# Patient Record
Sex: Male | Born: 1995 | Race: White | Hispanic: No | Marital: Single | Smoking: Former smoker
Health system: Southern US, Community
[De-identification: ages and names within clinical notes are randomized; demographics above are authoritative.]

## PROBLEM LIST (undated history)

## (undated) HISTORY — PX: NO PAST SURGERIES: SHX2092

---
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
## 2018-07-03 ENCOUNTER — Other Ambulatory Visit: Payer: Self-pay

## 2018-07-03 ENCOUNTER — Emergency Department
Admission: EM | Admit: 2018-07-03 | Discharge: 2018-07-03 | Disposition: A | Discharge status: Home / Self Care | Payer: 59 | Attending: Emergency Medicine | Admitting: Emergency Medicine

## 2018-07-03 ENCOUNTER — Encounter: Payer: Self-pay | Admitting: Emergency Medicine

## 2018-07-03 DIAGNOSIS — R58 Hemorrhage, not elsewhere classified: Secondary | ICD-10-CM | POA: Diagnosis not present

## 2018-07-03 DIAGNOSIS — I809 Phlebitis and thrombophlebitis of unspecified site: Secondary | ICD-10-CM

## 2018-07-03 DIAGNOSIS — I808 Phlebitis and thrombophlebitis of other sites: Secondary | ICD-10-CM | POA: Diagnosis not present

## 2018-07-03 DIAGNOSIS — L819 Disorder of pigmentation, unspecified: Secondary | ICD-10-CM | POA: Diagnosis present

## 2018-07-03 NOTE — ED Provider Notes (Signed)
[facility] Emergency Department Provider Note  ____________________________________________  Time seen: Approximately 7:08 PM  I have reviewed the triage vital signs and the nursing notes.   HISTORY  Chief Complaint Arm Injury    HPI Dylan Huang is a 22 y.o. male resents emergency department with his mother for complaint of bruise to the left forearm.  Patient reports a bruise to the left medial forearm that he first visualized yesterday.  He reports that he believes it to be a bruise, however his mother is concerned that it may be a blood clot.  Patient has no bleeding or clotting disorders.  He denies any significant trauma to the area.  No recent surgeries, infections, hormone use, long distance travel.  Patient denies any pain to the area.  He denies any swelling to the extremity.  Patient reports that he is here to reassure his mother that he does not have a blood clot.   Patient denies any palpitations, chest pain, shortness of breath, coughing.  History reviewed. No pertinent past medical history.  Patient Active Problem List   Diagnosis Date Noted  . OBESITY, NOS 01/25/2007    History reviewed. No pertinent surgical history.  Prior to Admission medications   Medication Sig Start Date End Date Taking? Authorizing Provider  HYDROcodone-acetaminophen (NORCO) 5-325 MG tablet Take 1 tablet by mouth every 6 (six) hours as needed for moderate pain. 07/02/16   Irean Hong, MD  hydrOXYzine (ATARAX/VISTARIL) 50 MG tablet Take 1 tablet (50 mg total) by mouth 3 (three) times daily as needed for itching. 09/20/15   Joni Reining, PA-C  ibuprofen (ADVIL,MOTRIN) 800 MG tablet Take 1 tablet (800 mg total) by mouth every 8 (eight) hours as needed for moderate pain. 07/02/16   Irean Hong, MD  methylPREDNISolone (MEDROL DOSEPAK) 4 MG TBPK tablet Take Tapered dose as directed 09/20/15   Joni Reining, PA-C    Allergies Cinnamon and Nickel  No family history on  file.  Social History Social History   Tobacco Use  . Smoking status: Never Smoker  . Smokeless tobacco: Never Used  Substance Use Topics  . Alcohol use: No  . Drug use: No     Review of Systems  Constitutional: No fever/chills Eyes: No visual changes.  Cardiovascular: no chest pain. Respiratory: no cough. No SOB. Gastrointestinal: No abdominal pain.  No nausea, no vomiting.   Musculoskeletal: Negative for musculoskeletal pain. Skin: Area of ecchymosis to the left medial forearm Neurological: Negative for headaches, focal weakness or numbness. 10-point ROS otherwise negative.  ____________________________________________   PHYSICAL EXAM:  VITAL SIGNS: ED Triage Vitals  Enc Vitals Group     BP 07/03/18 1833 (!) 144/82     Pulse Rate 07/03/18 1833 72     Resp --      Temp 07/03/18 1833 98.2 F (36.8 C)     Temp Source 07/03/18 1833 Oral     SpO2 07/03/18 1833 100 %     Weight 07/03/18 1833 240 lb (108.9 kg)     Height 07/03/18 1833 6\' 1"  (1.854 m)     Head Circumference --      Peak Flow --      Pain Score 07/03/18 1839 1     Pain Loc --      Pain Edu? --      Excl. in GC? --      Constitutional: Alert and oriented. Well appearing and in no acute distress. Eyes: Conjunctivae are normal.  PERRL. EOMI. Head: Atraumatic. Neck: No stridor.    Cardiovascular: Normal rate, regular rhythm. Normal S1 and S2.  Good peripheral circulation. Respiratory: Normal respiratory effort without tachypnea or retractions. Lungs CTAB. Good air entry to the bases with no decreased or absent breath sounds. Musculoskeletal: Full range of motion to all extremities. No gross deformities appreciated. Neurologic:  Normal speech and language. No gross focal neurologic deficits are appreciated.  Skin:  Skin is warm, dry and intact. No rash noted.  Small area of ecchymosis noted to the left medial forearm.  This measures approximately 3 cm in diameter.  Area feels to be healing with  yellow-green-blue discoloration around the edges of ecchymosis.  Area is nontender to palpation.  Central areas consistent with phlebitis.  Left upper extremity has overall visibly unremarkable with the exception of ecchymosis.  No other areas of ecchymosis are appreciated.  No edema, erythema noted to the entire upper extremity.  No track marks. Psychiatric: Mood and affect are normal. Speech and behavior are normal. Patient exhibits appropriate insight and judgement.   ____________________________________________   LABS (all labs ordered are listed, but only abnormal results are displayed)  Labs Reviewed - No data to display ____________________________________________  EKG   ____________________________________________  RADIOLOGY   No results found.  ____________________________________________    PROCEDURES  Procedure(s) performed:    Procedures    Medications - No data to display   ____________________________________________   INITIAL IMPRESSION / ASSESSMENT AND PLAN / ED COURSE  Pertinent labs & imaging results that were available during my care of the patient were reviewed by me and considered in my medical decision making (see chart for details).  Review of the [place] CSRS was performed in accordance of the [facility] prior to dispensing any controlled drugs.      Patient's diagnosis is consistent with ecchymosis/phlebitis to left forearm.  Patient presents with his mother who is concerned that the patient may have a DVT.  Patient has solitary area of ecchymosis to the left forearm.  No indication of DVT on exam.  Patient has findings consistent with facial phlebitis underlying ecchymosis.  No bleeding or clotting disorders.  Mother is concerned as patient's grandfather had a history of DVT and MI.  I recommended warm compresses to the area with anti-inflammatories at home.  Prescriptions at this time.  No indication for labs or imaging of the area..  Patient will  follow-up with primary care as needed.  Patient is given ED precautions to return to the ED for any worsening or new symptoms.     ____________________________________________  FINAL CLINICAL IMPRESSION(S) / ED DIAGNOSES  Final diagnoses:  Ecchymosis  Phlebitis      NEW MEDICATIONS STARTED DURING THIS VISIT:  ED Discharge Orders    None          This chart was dictated using voice recognition software/Dragon. Despite best efforts to proofread, errors can occur which can change the meaning. Any change was purely unintentional.    Racheal PatchesCuthriell, Jonathan D, PA-C 07/03/18 1914    Minna AntisPaduchowski, Kevin, MD 07/03/18 2203

## 2018-07-03 NOTE — ED Notes (Signed)
Pt reports that he has a bruise on inner aspect of left forearm - pt denies any injury and is only here because mother feels like it could be a blood clot

## 2018-07-03 NOTE — ED Triage Notes (Signed)
Patient has an area of blue green discoloration to right fore arm. Mom is concerned patient has a blood clot in arm.  Noticed area yesterday.  Patient does not recall injuring arm.

## 2018-12-17 ENCOUNTER — Other Ambulatory Visit: Payer: Self-pay

## 2018-12-17 ENCOUNTER — Ambulatory Visit
Admission: EM | Admit: 2018-12-17 | Discharge: 2018-12-17 | Disposition: A | Discharge status: Home / Self Care | Payer: BLUE CROSS/BLUE SHIELD | Attending: Family Medicine | Admitting: Family Medicine

## 2018-12-17 DIAGNOSIS — R509 Fever, unspecified: Secondary | ICD-10-CM | POA: Insufficient documentation

## 2018-12-17 LAB — URINALYSIS, COMPLETE (UACMP) WITH MICROSCOPIC
Bacteria, UA: NONE SEEN
Bilirubin Urine: NEGATIVE
GLUCOSE, UA: NEGATIVE mg/dL
Ketones, ur: NEGATIVE mg/dL
Leukocytes, UA: NEGATIVE
Nitrite: NEGATIVE
PH: 5.5 (ref 5.0–8.0)
PROTEIN: NEGATIVE mg/dL
SQUAMOUS EPITHELIAL / LPF: NONE SEEN (ref 0–5)
Specific Gravity, Urine: <1.005 — ABNORMAL LOW (ref 1.005–1.030)
WBC, UA: NONE SEEN WBC/hpf (ref 0–5)

## 2018-12-17 LAB — CBC WITH DIFFERENTIAL/PLATELET
Abs Immature Granulocytes: 0.03 10*3/uL (ref 0.00–0.07)
Basophils Absolute: 0 10*3/uL (ref 0.0–0.1)
Basophils Relative: 0 %
Eosinophils Absolute: 0 10*3/uL (ref 0.0–0.5)
Eosinophils Relative: 0 %
HCT: 42.4 % (ref 39.0–52.0)
Hemoglobin: 14.2 g/dL (ref 13.0–17.0)
Immature Granulocytes: 0 %
Lymphocytes Relative: 15 %
Lymphs Abs: 1.1 10*3/uL (ref 0.7–4.0)
MCH: 28.9 pg (ref 26.0–34.0)
MCHC: 33.5 g/dL (ref 30.0–36.0)
MCV: 86.4 fL (ref 80.0–100.0)
Monocytes Absolute: 1.2 10*3/uL — ABNORMAL HIGH (ref 0.1–1.0)
Monocytes Relative: 16 %
Neutro Abs: 5.3 10*3/uL (ref 1.7–7.7)
Neutrophils Relative %: 69 %
Platelets: 277 10*3/uL (ref 150–400)
RBC: 4.91 MIL/uL (ref 4.22–5.81)
RDW: 13.4 % (ref 11.5–15.5)
WBC: 7.7 10*3/uL (ref 4.0–10.5)
nRBC: 0 % (ref 0.0–0.2)

## 2018-12-17 LAB — COMPREHENSIVE METABOLIC PANEL
ALT: 30 U/L (ref 0–44)
AST: 44 U/L — ABNORMAL HIGH (ref 15–41)
Albumin: 4.6 g/dL (ref 3.5–5.0)
Alkaline Phosphatase: 55 U/L (ref 38–126)
Anion gap: 12 (ref 5–15)
BUN: 13 mg/dL (ref 6–20)
CO2: 25 mmol/L (ref 22–32)
Calcium: 9 mg/dL (ref 8.9–10.3)
Chloride: 99 mmol/L (ref 98–111)
Creatinine, Ser: 1.3 mg/dL — ABNORMAL HIGH (ref 0.61–1.24)
GFR calc Af Amer: >60 mL/min (ref >60)
GFR calc non Af Amer: >60 mL/min (ref >60)
Glucose, Bld: 92 mg/dL (ref 70–99)
Potassium: 4 mmol/L (ref 3.5–5.1)
Sodium: 136 mmol/L (ref 135–145)
Total Bilirubin: 0.5 mg/dL (ref 0.3–1.2)
Total Protein: 8 g/dL (ref 6.5–8.1)

## 2018-12-17 LAB — SEDIMENTATION RATE: Sed Rate: 13 mm/hr (ref 0–15)

## 2018-12-17 NOTE — ED Provider Notes (Signed)
[facility]    CSN: [id number] Arrival date & time: 12/17/18  1613     History   Chief Complaint Chief Complaint  Patient presents with  . Fever  . Dysuria    HPI Dylan Huang is a 23 y.o. male.   HPI  Male presents with fevers that he has had for the past 3 weeks.  He states that it associated with a dry cough and migrainous type headaches.  He states that his temperature was 101.5 earlier today but upon admission to our clinic it was 99.1.  He was seen at the emergency room yesterday for his the same problems.  Burning sensation with urination for about 3 weeks.  States that there is no chance of him having STD since he has been sexually inactive.  Patient has no pertinent past medical history.  Temperatures have vacillated between 101.7 and normal.  Yesterday at the emergency room reported chills nausea and vomiting.  Had a productive cough.  Endorses back pain.  He has not had previous symptoms similar to this and takes no daily medications is had no recent foreign travel.  Denies alcohol tobacco and drug use.          History reviewed. No pertinent past medical history.  Patient Active Problem List   Diagnosis Date Noted  . OBESITY, NOS 01/25/2007    History reviewed. No pertinent surgical history.     Home Medications    Prior to Admission medications   Not on File    Family History History reviewed. No pertinent family history.  Social History Social History   Tobacco Use  . Smoking status: Never Smoker  . Smokeless tobacco: Never Used  Substance Use Topics  . Alcohol use: No  . Drug use: No     Allergies   Cinnamon and Nickel   Review of Systems Review of Systems  Constitutional: Positive for activity change, chills, fatigue and fever. Negative for unexpected weight change.  Gastrointestinal: Positive for nausea and vomiting.  Genitourinary: Positive for dysuria.  Musculoskeletal: Positive for back pain.  All other  systems reviewed and are negative.    Physical Exam Triage Vital Signs ED Triage Vitals  Enc Vitals Group     BP 12/17/18 1635 126/70     Pulse Rate 12/17/18 1635 99     Resp 12/17/18 1635 18     Temp 12/17/18 1635 99.1 F (37.3 C)     Temp Source 12/17/18 1635 Oral     SpO2 12/17/18 1635 100 %     Weight 12/17/18 1634 240 lb (108.9 kg)     Height 12/17/18 1634 6\' 1"  (1.854 m)     Head Circumference --      Peak Flow --      Pain Score 12/17/18 1634 6     Pain Loc --      Pain Edu? --      Excl. in GC? --    No data found.  Updated Vital Signs BP 126/70 (BP Location: Right Arm)   Pulse 99   Temp 99.1 F (37.3 C) (Oral)   Resp 18   Ht 6\' 1"  (1.854 m)   Wt 240 lb (108.9 kg)   SpO2 100%   BMI 31.66 kg/m   Visual Acuity Right Eye Distance:   Left Eye Distance:   Bilateral Distance:    Right Eye Near:   Left Eye Near:    Bilateral Near:     Physical Exam Vitals  signs and nursing note reviewed.  Constitutional:      General: He is not in acute distress.    Appearance: Normal appearance. He is not ill-appearing, toxic-appearing or diaphoretic.  HENT:     Head: Normocephalic.     Right Ear: Tympanic membrane, ear canal and external ear normal.     Left Ear: Tympanic membrane, ear canal and external ear normal.     Nose: Nose normal. No congestion or rhinorrhea.     Mouth/Throat:     Mouth: Mucous membranes are moist.     Pharynx: No oropharyngeal exudate or posterior oropharyngeal erythema.  Eyes:     General:        Right eye: No discharge.        Left eye: No discharge.     Conjunctiva/sclera: Conjunctivae normal.  Neck:     Musculoskeletal: Normal range of motion and neck supple.  Pulmonary:     Effort: Pulmonary effort is normal.     Breath sounds: Normal breath sounds.  Abdominal:     General: Bowel sounds are normal.     Palpations: Abdomen is soft.  Musculoskeletal: Normal range of motion.        General: No swelling, tenderness, deformity or  signs of injury.     Right lower leg: No edema.     Left lower leg: No edema.  Lymphadenopathy:     Cervical: No cervical adenopathy.  Skin:    General: Skin is warm and dry.  Neurological:     General: No focal deficit present.     Mental Status: He is alert and oriented to person, place, and time.  Psychiatric:        Mood and Affect: Mood normal.        Behavior: Behavior normal.        Thought Content: Thought content normal.        Judgment: Judgment normal.      UC Treatments / Results  Labs (all labs ordered are listed, but only abnormal results are displayed) Labs Reviewed  URINALYSIS, COMPLETE (UACMP) WITH MICROSCOPIC - Abnormal; Notable for the following components:      Result Value   Specific Gravity, Urine <1.005 (*)    Hgb urine dipstick TRACE (*)    All other components within normal limits  CBC WITH DIFFERENTIAL/PLATELET - Abnormal; Notable for the following components:   Monocytes Absolute 1.2 (*)    All other components within normal limits  COMPREHENSIVE METABOLIC PANEL - Abnormal; Notable for the following components:   Creatinine, Ser 1.30 (*)    AST 44 (*)    All other components within normal limits  SEDIMENTATION RATE    EKG None  Radiology No results found.  Procedures Procedures (including critical care time)  Medications Ordered in UC Medications - No data to display  Initial Impression / Assessment and Plan / UC Course  I have reviewed the triage vital signs and the nursing notes.  Pertinent labs & imaging results that were available during my care of the patient were reviewed by me and considered in my medical decision making (see chart for details).   I reviewed the laboratory findings and his physical exam.  There are no alarming findings today; believe because of his continuing diffuse and nonspecific complaints along with the fevers he is reporting that further work-up is warranted with a primary care physician.  Requested   antibiotics today and I told him it is not indicated since I do not  know what is diagnosis or what we be treating.  Need to follow-up with the primary care physician for further management.  No medications were given at this time.  Worsens he should go to the emergency room.   Final Clinical Impressions(s) / UC Diagnoses   Final diagnoses:  Fever, unspecified     Discharge Instructions     Drink plenty of fluids to keep your urine pale yellow or clear.  Follow-up with your primary care physician as soon as possible in order to continue the work-up.    ED Prescriptions    None     Controlled Substance Prescriptions [place] Controlled Substance Registry consulted? Not Applicable   Lutricia Feil, PA-C 12/17/18 2121

## 2018-12-17 NOTE — ED Triage Notes (Addendum)
Pt with fevers for past 3 weeks. Dry cough and "migraines".  States it was 101.5 earlier today.  Was seen at ER for same yesterday. Also does report burning sensation with urination for about 3 weeks. Denies unprotected sex and states no chance of STD

## 2018-12-17 NOTE — Discharge Instructions (Addendum)
Drink plenty of fluids to keep your urine pale yellow or clear.  Follow-up with your primary care physician as soon as possible in order to continue the work-up.

## 2021-04-01 ENCOUNTER — Ambulatory Visit: Payer: BC Managed Care – PPO | Admitting: Urology

## 2021-04-01 ENCOUNTER — Encounter: Payer: Self-pay | Admitting: Urology

## 2021-04-01 ENCOUNTER — Other Ambulatory Visit: Payer: Self-pay

## 2021-04-01 VITALS — BP 134/83 | HR 67 | Ht 73.0 in | Wt 240.0 lb

## 2021-04-01 DIAGNOSIS — N3281 Overactive bladder: Secondary | ICD-10-CM | POA: Diagnosis not present

## 2021-04-01 DIAGNOSIS — R32 Unspecified urinary incontinence: Secondary | ICD-10-CM | POA: Diagnosis not present

## 2021-04-01 LAB — URINALYSIS, COMPLETE
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
## 2021-04-01 NOTE — Patient Instructions (Signed)
Cystoscopy Cystoscopy is a procedure that is used to help diagnose and sometimes treat conditions that affect the lower urinary tract. The lower urinary tract includes the bladder and the urethra. The urethra is the tube that drains urine from the bladder. Cystoscopy is done using a thin, tube-shaped instrument with a light and camera at the end (cystoscope). The cystoscope may be hard or flexible, depending on the goal of the procedure. The cystoscope is inserted through the urethra, into the bladder. Cystoscopy may be recommended if you have:  Urinary tract infections that keep coming back.  Blood in the urine (hematuria).  An inability to control when you urinate (urinary incontinence) or an overactive bladder.  Unusual cells found in a urine sample.  A blockage in the urethra, such as a urinary stone.  Painful urination.  An abnormality in the bladder found during an intravenous pyelogram (IVP) or CT scan. Cystoscopy may also be done to remove a sample of tissue to be examined under a microscope (biopsy). What are the risks? Generally, this is a safe procedure. However, problems may occur, including:  Infection.  Bleeding.  What happens during the procedure?  1. You will be given one or more of the following: ? A medicine to numb the area (local anesthetic). 2. The area around the opening of your urethra will be cleaned. 3. The cystoscope will be passed through your urethra into your bladder. 4. Germ-free (sterile) fluid will flow through the cystoscope to fill your bladder. The fluid will stretch your bladder so that your health care provider can clearly examine your bladder walls. 5. Your doctor will look at the urethra and bladder. 6. The cystoscope will be removed The procedure may vary among health care providers  What can I expect after the procedure? After the procedure, it is common to have: 1. Some soreness or pain in your abdomen and urethra. 2. Urinary symptoms.  These include: ? Mild pain or burning when you urinate. Pain should stop within a few minutes after you urinate. This may last for up to 1 week. ? A small amount of blood in your urine for several days. ? Feeling like you need to urinate but producing only a small amount of urine. Follow these instructions at home: General instructions  Return to your normal activities as told by your health care provider.   Do not drive for 24 hours if you were given a sedative during your procedure.  Watch for any blood in your urine. If the amount of blood in your urine increases, call your health care provider.  If a tissue sample was removed for testing (biopsy) during your procedure, it is up to you to get your test results. Ask your health care provider, or the department that is doing the test, when your results will be ready.  Drink enough fluid to keep your urine pale yellow.  Keep all follow-up visits as told by your health care provider. This is important. Contact a health care provider if you:  Have pain that gets worse or does not get better with medicine, especially pain when you urinate.  Have trouble urinating.  Have more blood in your urine. Get help right away if you:  Have blood clots in your urine.  Have abdominal pain.  Have a fever or chills.  Are unable to urinate. Summary  Cystoscopy is a procedure that is used to help diagnose and sometimes treat conditions that affect the lower urinary tract.  Cystoscopy is done using   a thin, tube-shaped instrument with a light and camera at the end.  After the procedure, it is common to have some soreness or pain in your abdomen and urethra.  Watch for any blood in your urine. If the amount of blood in your urine increases, call your health care provider.  If you were prescribed an antibiotic medicine, take it as told by your health care provider. Do not stop taking the antibiotic even if you start to feel better. This  information is not intended to replace advice given to you by your health care provider. Make sure you discuss any questions you have with your health care provider. Document Revised: 11/06/2018 Document Reviewed: 11/06/2018 Elsevier Patient Education  2020 [employer]   

## 2021-04-02 LAB — MICROSCOPIC EXAMINATION
Bacteria, UA: NONE SEEN
Epithelial Cells (non renal): NONE SEEN /hpf (ref 0–10)

## 2021-04-02 LAB — URINALYSIS, COMPLETE
Bilirubin, UA: NEGATIVE
Glucose, UA: NEGATIVE
Ketones, UA: NEGATIVE
Leukocytes,UA: NEGATIVE
Nitrite, UA: POSITIVE — AB
Protein,UA: NEGATIVE
RBC, UA: NEGATIVE
Urobilinogen, Ur: 0.2 mg/dL (ref 0.2–1.0)
pH, UA: 7.5 (ref 5.0–7.5)

## 2021-04-07 ENCOUNTER — Encounter: Payer: Self-pay | Admitting: Urology

## 2021-04-07 ENCOUNTER — Ambulatory Visit (INDEPENDENT_AMBULATORY_CARE_PROVIDER_SITE_OTHER): Payer: BC Managed Care – PPO | Admitting: Urology

## 2021-04-07 ENCOUNTER — Other Ambulatory Visit: Payer: Self-pay

## 2021-04-07 VITALS — BP 149/73 | HR 73 | Ht 73.0 in | Wt 240.0 lb

## 2021-04-07 DIAGNOSIS — N3281 Overactive bladder: Secondary | ICD-10-CM

## 2021-04-07 NOTE — Progress Notes (Signed)
He was originally scheduled for cystoscopy today for evaluation of 1 week of urinary urgency, frequency, nocturia.  He has a history of a meatotomy as a child.  Urinalysis was benign.  We gave him samples of Myrbetriq for his symptoms until he can get in for cystoscopy, and he reports complete resolution with the Myrbetriq.  He would like to defer cystoscopy today.  We discussed the risks of missing pathology like urethral stricture or other abnormalities, and he understands these risk.  I recommend he stop the Myrbetriq in the next few days to see if his symptoms return.  RTC 1 month symptom check with UA and PVR.  Return precautions discussed extensively  Legrand Rams, MD 04/07/2021

## 2021-04-15 ENCOUNTER — Telehealth: Payer: Self-pay

## 2021-04-15 NOTE — Telephone Encounter (Signed)
Physician Assistant Royal Hawthorn Piersing left a message on triage line asking Provider to call him on his cell to discuss his recent visit with patient Taygen Acklin. PA cell number is [phone]

## 2021-04-21 ENCOUNTER — Ambulatory Visit
Admission: EM | Admit: 2021-04-21 | Discharge: 2021-04-21 | Disposition: A | Discharge status: Home / Self Care | Payer: BC Managed Care – PPO | Attending: Sports Medicine | Admitting: Sports Medicine

## 2021-04-21 ENCOUNTER — Other Ambulatory Visit: Payer: Self-pay

## 2021-04-21 DIAGNOSIS — R350 Frequency of micturition: Secondary | ICD-10-CM | POA: Diagnosis present

## 2021-04-21 LAB — URINALYSIS, COMPLETE (UACMP) WITH MICROSCOPIC
Bacteria, UA: NONE SEEN
Bilirubin Urine: NEGATIVE
Glucose, UA: NEGATIVE mg/dL
Hgb urine dipstick: NEGATIVE
Ketones, ur: NEGATIVE mg/dL
Leukocytes,Ua: NEGATIVE
Nitrite: NEGATIVE
Protein, ur: NEGATIVE mg/dL
Specific Gravity, Urine: 1.01 (ref 1.005–1.030)
pH: 7 (ref 5.0–8.0)

## 2021-04-21 MED ORDER — PHENAZOPYRIDINE HCL 200 MG PO TABS: 200.0000 mg | ORAL_TABLET | Freq: Three times a day (TID) | ORAL | 0 refills | Status: DC

## 2021-04-21 NOTE — ED Provider Notes (Signed)
[facility]    CSN: [id number] Arrival date & time: 04/21/21  1856      History   Chief Complaint Chief Complaint  Patient presents with  . Urinary Frequency  . Dysuria  . Diarrhea    HPI Dylan Huang is a 25 y.o. male.   HPI   25 year old male here for evaluation of urinary complaints.  Patient reports that he has been experiencing urinary urgency and frequency for the last 3 to 4 weeks.  More recently he developed painful urination and also noticed his urine has been cloudy.  For the last 3 days he has had mushy stools as well but denies liquid stools.  Patient denies incontinence, blood in his urine, nausea or vomiting, or fever.  History reviewed. No pertinent past medical history.  Patient Active Problem List   Diagnosis Date Noted  . OBESITY, NOS 01/25/2007    Past Surgical History:  Procedure Laterality Date  . NO PAST SURGERIES         Home Medications    Prior to Admission medications   Medication Sig Start Date End Date Taking? Authorizing Provider  mirabegron ER (MYRBETRIQ) 25 MG TB24 tablet Take 25 mg by mouth daily.   Yes [provider]  phenazopyridine (PYRIDIUM) 200 MG tablet Take 1 tablet (200 mg total) by mouth 3 (three) times daily. 04/21/21   Becky Augusta, NP    Family History Family History  Problem Relation Age of Onset  . Healthy Mother   . Healthy Father     Social History Social History   Tobacco Use  . Smoking status: Never Smoker  . Smokeless tobacco: Never Used  Substance Use Topics  . Alcohol use: No  . Drug use: No     Allergies   Cinnamon and Nickel   Review of Systems Review of Systems  Constitutional: Negative for appetite change and fever.  Gastrointestinal: Negative for abdominal pain, diarrhea, nausea and vomiting.  Genitourinary: Positive for dysuria, frequency and urgency. Negative for hematuria, penile discharge, penile pain and penile swelling.  Musculoskeletal: Negative for  back pain.  Skin: Negative for rash.  Hematological: Negative.   Psychiatric/Behavioral: Negative.      Physical Exam Triage Vital Signs ED Triage Vitals  Enc Vitals Group     BP 04/21/21 1919 135/67     Pulse Rate 04/21/21 1919 66     Resp 04/21/21 1919 18     Temp 04/21/21 1919 98.4 F (36.9 C)     Temp Source 04/21/21 1919 Oral     SpO2 04/21/21 1919 100 %     Weight 04/21/21 1917 236 lb (107 kg)     Height 04/21/21 1917 6\' 1"  (1.854 m)     Head Circumference --      Peak Flow --      Pain Score 04/21/21 1917 3     Pain Loc --      Pain Edu? --      Excl. in GC? --    No data found.  Updated Vital Signs BP 135/67 (BP Location: Right Arm)   Pulse 66   Temp 98.4 F (36.9 C) (Oral)   Resp 18   Ht 6\' 1"  (1.854 m)   Wt 236 lb (107 kg)   SpO2 100%   BMI 31.14 kg/m   Visual Acuity Right Eye Distance:   Left Eye Distance:   Bilateral Distance:    Right Eye Near:   Left Eye Near:  Bilateral Near:     Physical Exam Vitals and nursing note reviewed.  Constitutional:      General: He is not in acute distress.    Appearance: Normal appearance. He is not ill-appearing.  HENT:     Head: Normocephalic and atraumatic.  Cardiovascular:     Rate and Rhythm: Normal rate and regular rhythm.     Pulses: Normal pulses.     Heart sounds: Normal heart sounds. No murmur heard. No gallop.   Pulmonary:     Effort: Pulmonary effort is normal.     Breath sounds: Normal breath sounds. No wheezing, rhonchi or rales.  Abdominal:     Tenderness: There is no right CVA tenderness or left CVA tenderness.  Skin:    General: Skin is warm and dry.     Capillary Refill: Capillary refill takes less than 2 seconds.     Findings: No erythema or rash.  Neurological:     General: No focal deficit present.     Mental Status: He is alert and oriented to person, place, and time.  Psychiatric:        Mood and Affect: Mood normal.        Behavior: Behavior normal.        Thought  Content: Thought content normal.        Judgment: Judgment normal.      UC Treatments / Results  Labs (all labs ordered are listed, but only abnormal results are displayed) Labs Reviewed  URINALYSIS, COMPLETE (UACMP) WITH MICROSCOPIC    EKG   Radiology No results found.  Procedures Procedures (including critical care time)  Medications Ordered in UC Medications - No data to display  Initial Impression / Assessment and Plan / UC Course  I have reviewed the triage vital signs and the nursing notes.  Pertinent labs & imaging results that were available during my care of the patient were reviewed by me and considered in my medical decision making (see chart for details).   Patient is a very pleasant, nontoxic-appearing 25 year old male here for evaluation of urinary complaints that have been an ongoing issue.  He is being followed by urology for overactive bladder and had been taking Myrbetriq.  He stopped taking it 1 week ago but resumed taking it today.  He reports that for the last 3 to 4 weeks he has had urinary urgency and frequency and then in the last week or so he has developed painful urination and cloudiness to his urine.  He is also complaining of 3 days worth of mushy stools but no overt diarrhea.  He denies any incontinence, blood in his urine, nausea, vomiting, or fever.  Patient reports that he was tested for STIs 2 weeks ago and his last sexual encounter was in 2019.  He states that he does not have any concerns over STIs but he would like to be checked for urinary tract infection.  Physical exam reveals benign cardiopulmonary exam.  Patient has no CVA tenderness on exam.  Abdomen is soft and nontender.  Urine collected in triage.  Urinalysis is completely unremarkable.  Will discharge patient home and have him continue his Myrbetriq and follow-up with urology.  Suspect patient's symptoms are secondary to his underlying overactive bladder.   Final Clinical  Impressions(s) / UC Diagnoses   Final diagnoses:  Urinary frequency     Discharge Instructions     Urinalysis today did not show the presence of an infection.  Continue your Myrbetriq as previously prescribed  by urology.  Use the Pyridium every 8 hours as needed for urinary discomfort.  Schedule follow-up appoint with urology to discuss your symptoms.    ED Prescriptions    Medication Sig Dispense Auth. Provider   phenazopyridine (PYRIDIUM) 200 MG tablet Take 1 tablet (200 mg total) by mouth 3 (three) times daily. 10 tablet Becky Augusta, NP     PDMP not reviewed this encounter.   Becky Augusta, NP 04/21/21 [phone]

## 2021-04-21 NOTE — ED Triage Notes (Signed)
Patient states that he has been having urinary frequency and urgency for a while and has been followed by urology. States that he would like to be tested for a UTI.   Patient states that he has been having diarrhea x 3 days.

## 2021-04-21 NOTE — Discharge Instructions (Addendum)
Urinalysis today did not show the presence of an infection.  Continue your Myrbetriq as previously prescribed by urology.  Use the Pyridium every 8 hours as needed for urinary discomfort.  Schedule follow-up appoint with urology to discuss your symptoms.

## 2021-05-06 ENCOUNTER — Ambulatory Visit: Payer: BC Managed Care – PPO | Admitting: Urology

## 2021-05-07 ENCOUNTER — Encounter: Payer: Self-pay | Admitting: Urology

## 2021-05-12 ENCOUNTER — Ambulatory Visit: Payer: BC Managed Care – PPO | Admitting: Urology

## 2021-05-20 ENCOUNTER — Other Ambulatory Visit: Payer: Self-pay

## 2021-05-20 ENCOUNTER — Ambulatory Visit
Admission: EM | Admit: 2021-05-20 | Discharge: 2021-05-20 | Disposition: A | Discharge status: Home / Self Care | Payer: BC Managed Care – PPO | Attending: Family Medicine | Admitting: Family Medicine

## 2021-05-20 DIAGNOSIS — M7918 Myalgia, other site: Secondary | ICD-10-CM | POA: Diagnosis present

## 2021-05-20 DIAGNOSIS — R350 Frequency of micturition: Secondary | ICD-10-CM | POA: Insufficient documentation

## 2021-05-20 LAB — URINALYSIS, COMPLETE (UACMP) WITH MICROSCOPIC
Bilirubin Urine: NEGATIVE
Glucose, UA: NEGATIVE mg/dL
Hgb urine dipstick: NEGATIVE
Ketones, ur: NEGATIVE mg/dL
Leukocytes,Ua: NEGATIVE
Nitrite: NEGATIVE
Protein, ur: NEGATIVE mg/dL
Specific Gravity, Urine: >1.03 — ABNORMAL HIGH (ref 1.005–1.030)
pH: 5 (ref 5.0–8.0)

## 2021-05-20 MED ORDER — MIRABEGRON ER 25 MG PO TB24: 25.0000 mg | ORAL_TABLET | Freq: Every day | ORAL | 3 refills | Status: DC

## 2021-05-20 MED ORDER — MELOXICAM 15 MG PO TABS: 15.0000 mg | ORAL_TABLET | Freq: Every day | ORAL | 0 refills | Status: DC | PRN

## 2021-05-20 NOTE — Discharge Instructions (Addendum)
Your urine is normal.  Consider decreasing your water intake.   Restart Myrbetriq.   Consider backing off on the Judo.  Use the mobic as needed for pain.  Follow up with Urology.

## 2021-05-20 NOTE — ED Triage Notes (Signed)
Patient states that he has been seen by urology recently and states that they cannot figure out what was wrong with him. States that he recently finished doxycycline and bactrim. States that he feels like these both caused heart palpitations, states that he finished these 3 days ago. States that he recently saw [facility]. States that he has been on mybetriq in the past. Reports that now he is having urinary frequency and burning.  Patient also states that he has been having neck and back pain x 2 months. Reports that he has limited ROM.

## 2021-05-20 NOTE — ED Provider Notes (Signed)
[facility]    CSN: [id number] Arrival date & time: 05/20/21  4315      History   Chief Complaint Chief Complaint  Patient presents with   Urinary Frequency   Neck Pain   HPI  25 year old male presents with multiple complaints.  Patient reports that he has had urinary symptoms for the past 3 months.  He reports urinary frequency, urgency, and dysuria.  He was previously seen by urology and placed on Myrbetriq with improvement.  Subsequent saw another urologist and was placed on doxycycline for suspected prostatitis.  He had some side effects from the doxycycline and it was stopped and he was placed on Bactrim.  He has since stopped the Bactrim due to what he feels are side effects.  He reports that his heart has been racing.  He is currently on no medications regarding his urinary symptoms.  He states that he drinks 1 gallon of water a day.  He has cut back from 2 gallons of water a day.  Additionally, patient reports ongoing neck pain and back pain.  He reports posterior neck pain, thoracic back pain, and lumbar back pain.  He states that this has been going on for 2 months.  Started after he began martial arts/judo.  He reports that his pain is worse with range of motion of the neck and with certain activities.  He rates his pain is 8/10 in severity.  No relieving factors.  Patient Active Problem List   Diagnosis Date Noted   OBESITY, NOS 01/25/2007   Past Surgical History:  Procedure Laterality Date   NO PAST SURGERIES     Home Medications    Prior to Admission medications   Medication Sig Start Date End Date Taking? Authorizing Provider  meloxicam (MOBIC) 15 MG tablet Take 1 tablet (15 mg total) by mouth daily as needed for pain. 05/20/21  Yes Sharma Lawrance G, DO  mirabegron ER (MYRBETRIQ) 25 MG TB24 tablet Take 1 tablet (25 mg total) by mouth daily. 05/20/21   Tommie Sams, DO    Family History Family History  Problem Relation Age of Onset   Healthy Mother     Healthy Father     Social History Social History   Tobacco Use   Smoking status: Never   Smokeless tobacco: Never  Substance Use Topics   Alcohol use: No   Drug use: No     Allergies   Cinnamon and Nickel   Review of Systems Review of Systems Per HPI  Physical Exam Triage Vital Signs ED Triage Vitals  Enc Vitals Group     BP 05/20/21 0953 126/79     Pulse Rate 05/20/21 0953 73     Resp 05/20/21 0953 18     Temp 05/20/21 0953 99.4 F (37.4 C)     Temp Source 05/20/21 0953 Oral     SpO2 05/20/21 0953 97 %     Weight 05/20/21 0952 235 lb 14.3 oz (107 kg)     Height 05/20/21 0952 6\' 1"  (1.854 m)     Head Circumference --      Peak Flow --      Pain Score 05/20/21 0952 8     Pain Loc --      Pain Edu? --      Excl. in GC? --    Updated Vital Signs BP 126/79 (BP Location: Right Arm)   Pulse 73   Temp 99.4 F (37.4 C) (Oral)   Resp 18  Ht 6\' 1"  (1.854 m)   Wt 107 kg   SpO2 97%   BMI 31.12 kg/m   Visual Acuity Right Eye Distance:   Left Eye Distance:   Bilateral Distance:    Right Eye Near:   Left Eye Near:    Bilateral Near:     Physical Exam Constitutional:      General: He is not in acute distress.    Appearance: Normal appearance. He is not ill-appearing.  HENT:     Head: Normocephalic and atraumatic.  Eyes:     General:        Right eye: No discharge.        Left eye: No discharge.     Conjunctiva/sclera: Conjunctivae normal.  Cardiovascular:     Rate and Rhythm: Normal rate and regular rhythm.     Heart sounds: No murmur heard. Pulmonary:     Effort: Pulmonary effort is normal.     Breath sounds: Normal breath sounds. No wheezing, rhonchi or rales.  Abdominal:     General: There is no distension.     Palpations: Abdomen is soft.     Comments: No significant tenderness on exam.  Musculoskeletal:     Comments: No discrete tenderness over the cervical, thoracic, or lumbar spine.  Neurological:     Mental Status: He is alert.   Psychiatric:     Comments: Anxious.      UC Treatments / Results  Labs (all labs ordered are listed, but only abnormal results are displayed) Labs Reviewed  URINALYSIS, COMPLETE (UACMP) WITH MICROSCOPIC - Abnormal; Notable for the following components:      Result Value   Specific Gravity, Urine >1.030 (*)    Bacteria, UA RARE (*)    All other components within normal limits    EKG   Radiology No results found.  Procedures Procedures (including critical care time)  Medications Ordered in UC Medications - No data to display  Initial Impression / Assessment and Plan / UC Course  I have reviewed the triage vital signs and the nursing notes.  Pertinent labs & imaging results that were available during my care of the patient were reviewed by me and considered in my medical decision making (see chart for details).    25 year old male presents with multiple complaints.  Regarding his urinary symptoms, the etiology is unclear.  His urinalysis is normal.  He states that he drinks at least a gallon of water a day.  I advised him to cut back as this may lead to frequency and urgency.  He has seen 2 urologists and has had an unremarkable work-up including a negative postvoid residual.  He was recently placed on doxycycline and subsequently Bactrim for possible prostatitis.  I find it unlikely that he is experiencing prostatitis given his age and lack of improvement with antibiotic therapy.  He is not sexually active.  I advised to restart Myrbetriq.  Follow-up with urology.  Regarding his musculoskeletal complaints, his exam is benign.  I advised him to use meloxicam as directed.  Consider cutting back on judo/martial arts.  Final Clinical Impressions(s) / UC Diagnoses   Final diagnoses:  Urinary frequency  Musculoskeletal pain     Discharge Instructions      Your urine is normal.  Consider decreasing your water intake.   Restart Myrbetriq.   Consider backing off on the  Judo.  Use the mobic as needed for pain.  Follow up with Urology.    ED Prescriptions  Medication Sig Dispense Auth. Provider   mirabegron ER (MYRBETRIQ) 25 MG TB24 tablet Take 1 tablet (25 mg total) by mouth daily. 30 tablet Lilyan Prete G, DO   meloxicam (MOBIC) 15 MG tablet Take 1 tablet (15 mg total) by mouth daily as needed for pain. 30 tablet Tommie Sams, DO      PDMP not reviewed this encounter.   Tommie Sams, [place] 05/20/21 1043

## 2021-05-21 ENCOUNTER — Ambulatory Visit: Payer: BC Managed Care – PPO | Admitting: Family Medicine

## 2021-10-20 ENCOUNTER — Encounter: Payer: Self-pay | Admitting: Emergency Medicine

## 2021-10-20 ENCOUNTER — Other Ambulatory Visit: Payer: Self-pay

## 2021-10-20 ENCOUNTER — Ambulatory Visit
Admission: EM | Admit: 2021-10-20 | Discharge: 2021-10-20 | Disposition: A | Discharge status: Home / Self Care | Payer: BC Managed Care – PPO

## 2021-10-20 DIAGNOSIS — M26621 Arthralgia of right temporomandibular joint: Secondary | ICD-10-CM
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
## 2021-10-20 NOTE — Discharge Instructions (Addendum)
-  Take ibuprofen 800mg  every 6-8 hours as needed for pain. -Purchase OTC mouthguard to wear at night. -Don't perform any contact sports until next week.

## 2022-02-16 DIAGNOSIS — S060X0A Concussion without loss of consciousness, initial encounter: Secondary | ICD-10-CM | POA: Diagnosis not present

## 2022-02-16 DIAGNOSIS — F430 Acute stress reaction: Secondary | ICD-10-CM | POA: Diagnosis not present

## 2022-02-16 DIAGNOSIS — S0990XA Unspecified injury of head, initial encounter: Secondary | ICD-10-CM | POA: Diagnosis not present

## 2022-02-18 DIAGNOSIS — F0781 Postconcussional syndrome: Secondary | ICD-10-CM | POA: Diagnosis not present

## 2022-02-18 DIAGNOSIS — R519 Headache, unspecified: Secondary | ICD-10-CM | POA: Diagnosis not present

## 2022-03-17 ENCOUNTER — Ambulatory Visit: Payer: Self-pay

## 2022-03-17 NOTE — Telephone Encounter (Signed)
The patient's mother shares that the patient was seen and treated at a [facility] for a concussion roughly 3-4 weeks ago  ? ?The patient's mother was uncertain of the exact date of injury or hospitalization  ? ?The patient is continuing to experience headaches and discomfort  ? ?The patient has a new patient appt scheduled for 05/06/22 but patient's mom would like to know what should be done about headaches until the appointment  ? ?The patient was not with their mother at the time of call  ? ?Please contact further  ? ? ?Chief Complaint: Concussion 4 weeks ago, seen at UC ?Symptoms: Has mild headaches at times ?Frequency: 4 weeks ago ?Pertinent Negatives: Patient denies any other symptoms ?Disposition: [] ED /[] Urgent Care (no appt availability in office) / [] Appointment(In office/virtual)/ []  [facility]/ [] Home Care/ [] Refused Recommended Disposition /[] [place] [facility]/ []  Follow-up with PCP ?Additional Notes: States he will call back if symptoms worsen, "but I feel fine now."  ?Answer Assessment - Initial Assessment Questions ?1. LOCATION: "Where does it hurt?"  ?    All over and more in the front ?2. ONSET: "When did the headache start?" (Minutes, hours or days)  ?    4 weeks ago ?3. PATTERN: "Does the pain come and go, or has it been constant since it started?" ?    Comes and goes ?4. SEVERITY: "How bad is the pain?" and "What does it keep you from doing?"  (e.g., Scale 1-10; mild, moderate, or severe) ?  - MILD (1-3): doesn't interfere with normal activities  ?  - MODERATE (4-7): interferes with normal activities or awakens from sleep  ?  - SEVERE (8-10): excruciating pain, unable to do any normal activities    ?    Mild ?5. RECURRENT SYMPTOM: "Have you ever had headaches before?" If Yes, ask: "When was the last time?" and "What happened that time?"  ?    Had concussion 4 weeks ago ?6. CAUSE: "What do you think is causing the headache?" ?    Concussion ?7. MIGRAINE: "Have you been diagnosed with  migraine headaches?" If Yes, ask: "Is this headache similar?"  ?    No ?8. HEAD INJURY: "Has there been any recent injury to the head?"  ?    Yes ?9. OTHER SYMPTOMS: "Do you have any other symptoms?" (fever, stiff neck, eye pain, sore throat, cold symptoms) ?    Some fatigue ?10. PREGNANCY: "Is there any chance you are pregnant?" "When was your last menstrual period?" ?      N/a ? ?Protocols used: Headache-A-AH ? ?

## 2022-04-05 DIAGNOSIS — H9192 Unspecified hearing loss, left ear: Secondary | ICD-10-CM | POA: Diagnosis not present

## 2022-04-05 DIAGNOSIS — H6122 Impacted cerumen, left ear: Secondary | ICD-10-CM | POA: Diagnosis not present

## 2022-05-06 ENCOUNTER — Ambulatory Visit: Payer: BC Managed Care – PPO | Admitting: Family Medicine

## 2022-05-27 DIAGNOSIS — S058X2A Other injuries of left eye and orbit, initial encounter: Secondary | ICD-10-CM | POA: Diagnosis not present

## 2023-01-08 ENCOUNTER — Ambulatory Visit: Admit: 2023-01-08 | Disposition: A | Discharge status: Other Acute Inpatient Hospital | Payer: BC Managed Care – PPO

## 2023-01-09 DIAGNOSIS — H5789 Other specified disorders of eye and adnexa: Secondary | ICD-10-CM | POA: Diagnosis not present

## 2023-03-12 DIAGNOSIS — J0120 Acute ethmoidal sinusitis, unspecified: Secondary | ICD-10-CM | POA: Diagnosis not present

## 2023-04-22 DIAGNOSIS — R110 Nausea: Secondary | ICD-10-CM | POA: Diagnosis not present

## 2023-04-22 DIAGNOSIS — H53143 Visual discomfort, bilateral: Secondary | ICD-10-CM | POA: Diagnosis not present

## 2023-04-22 DIAGNOSIS — G44319 Acute post-traumatic headache, not intractable: Secondary | ICD-10-CM | POA: Diagnosis not present

## 2023-04-22 DIAGNOSIS — R42 Dizziness and giddiness: Secondary | ICD-10-CM | POA: Diagnosis not present

## 2023-04-22 DIAGNOSIS — R519 Headache, unspecified: Secondary | ICD-10-CM | POA: Diagnosis not present

## 2023-05-18 ENCOUNTER — Ambulatory Visit
Admission: EM | Admit: 2023-05-18 | Discharge: 2023-05-18 | Disposition: A | Discharge status: Home / Self Care | Payer: BC Managed Care – PPO | Attending: Family Medicine | Admitting: Family Medicine

## 2023-05-18 DIAGNOSIS — R21 Rash and other nonspecific skin eruption: Secondary | ICD-10-CM

## 2023-05-18 MED ORDER — NYSTATIN-TRIAMCINOLONE 100000-0.1 UNIT/GM-% EX OINT: 1.0000 | TOPICAL_OINTMENT | Freq: Two times a day (BID) | CUTANEOUS | 0 refills | Status: DC

## 2023-05-18 NOTE — ED Provider Notes (Signed)
[facility]    CSN: [id number] Arrival date & time: 05/18/23  1753      History   Chief Complaint Chief Complaint  Patient presents with   Rash    HPI Dylan Huang is a 27 y.o. male.   HPI  Dylan Huang presents for chest rash for over a week. His mom brought home some kittens that had ringworm. It does not itch. The area is somewhat red.  One of the kittens laid on his chest. The itching extends to his neck.  He put some anti-itch cream on it without relief. Denies chest pain, shortness of breath, joint pain, headache. Denies new lotions, medications, foods or supplements. No known but bites. Itching doesn't get worse at night. Dylan Huang has otherwise been well and has no other concerns.   History reviewed. No pertinent past medical history.  Patient Active Problem List   Diagnosis Date Noted   OBESITY, NOS 01/25/2007    Past Surgical History:  Procedure Laterality Date   NO PAST SURGERIES         Home Medications    Prior to Admission medications   Medication Sig Start Date End Date Taking? Authorizing Provider  nystatin-triamcinolone ointment (MYCOLOG) Apply 1 Application topically 2 (two) times daily. 05/18/23  Yes Gregori Abril, DO  meloxicam (MOBIC) 15 MG tablet Take 1 tablet (15 mg total) by mouth daily as needed for pain. 05/20/21   Tommie Sams, DO  mirabegron ER (MYRBETRIQ) 25 MG TB24 tablet Take 1 tablet (25 mg total) by mouth daily. 05/20/21   Tommie Sams, DO    Family History Family History  Problem Relation Age of Onset   Healthy Mother    Healthy Father     Social History Social History   Tobacco Use   Smoking status: Never   Smokeless tobacco: Never  Vaping Use   Vaping Use: Never used  Substance Use Topics   Alcohol use: No   Drug use: No     Allergies   Cinnamon and Nickel   Review of Systems Review of Systems :negative unless otherwise stated in HPI.      Physical Exam Triage Vital Signs ED Triage Vitals  Enc  Vitals Group     BP 05/18/23 1813 131/75     Pulse Rate 05/18/23 1813 60     Resp --      Temp --      Temp src --      SpO2 05/18/23 1813 98 %     Weight 05/18/23 1809 190 lb (86.2 kg)     Height 05/18/23 1809 6\' 1"  (1.854 m)     Head Circumference --      Peak Flow --      Pain Score 05/18/23 1808 0     Pain Loc --      Pain Edu? --      Excl. in GC? --    No data found.  Updated Vital Signs BP 131/75 (BP Location: Left Arm)   Pulse 60   Ht 6\' 1"  (1.854 m)   Wt 86.2 kg   SpO2 98%   BMI 25.07 kg/m   Visual Acuity Right Eye Distance:   Left Eye Distance:   Bilateral Distance:    Right Eye Near:   Left Eye Near:    Bilateral Near:     Physical Exam  GEN: alert, well appearing male, in no acute distress  EYES: extra occular movements intact, no scleral injection CV:  regular rate, rhythm  RESP: no increased work of breathing, clear to ascultation bilaterally MSK: no extremity edema NEURO: alert, moves all extremities appropriately SKIN: warm and dry; erythematous patches without excoriations or papules On chest wall, some areas fluoresce under the woods lamp   UC Treatments / Results  Labs (all labs ordered are listed, but only abnormal results are displayed) Labs Reviewed - No data to display  EKG   Radiology No results found.  Procedures Procedures (including critical care time)  Medications Ordered in UC Medications - No data to display  Initial Impression / Assessment and Plan / UC Course  I have reviewed the triage vital signs and the nursing notes.  Pertinent labs & imaging results that were available during my care of the patient were reviewed by me and considered in my medical decision making (see chart for details).     Patient is a 27 y.o. malewho presents for rash after exposed to kittens with ring worms.  Overall, patient is well-appearing and well-hydrated.  Vital signs stable.  Dylan Huang is afebrile.  Exam concerning for contact dermatitis  vs fungal disorder.  Treat with anti-fungal-steroid ointment. No sign of bacterial infection to suggest antibiotics at this time.    Reviewed expectations regarding course of current medical issues.  All questions asked were answered.  Outlined signs and symptoms indicating need for more acute intervention. Patient verbalized understanding. After Visit Summary given.   Final Clinical Impressions(s) / UC Diagnoses   Final diagnoses:  Rash     Discharge Instructions      Stop by the pharmacy to pick up your prescription.      ED Prescriptions     Medication Sig Dispense Auth. Provider   nystatin-triamcinolone ointment (MYCOLOG) Apply 1 Application topically 2 (two) times daily. 30 g Katha Cabal, DO      PDMP not reviewed this encounter.              Katha Cabal, DO 05/21/23 1507

## 2023-05-18 NOTE — Discharge Instructions (Signed)
Stop by the pharmacy to pick up your prescription.

## 2023-05-18 NOTE — ED Triage Notes (Signed)
Pt c/o ringworm x1-2weeks  Pt states that his mom brought home some cats who had ringworm and believes he caught some along his bottom neck and chest.  Pt has red bumps and marks along his upper chest below the collarbone. Pt denies any pain, burning, or itching.  Pt states that he is allergic to a bladder medication but does not remember what it was.

## 2023-05-24 ENCOUNTER — Ambulatory Visit
Admission: EM | Admit: 2023-05-24 | Discharge: 2023-05-24 | Disposition: A | Discharge status: Home / Self Care | Payer: BC Managed Care – PPO | Attending: Physician Assistant | Admitting: Physician Assistant

## 2023-05-24 DIAGNOSIS — H1011 Acute atopic conjunctivitis, right eye: Secondary | ICD-10-CM | POA: Diagnosis not present

## 2023-05-24 DIAGNOSIS — R21 Rash and other nonspecific skin eruption: Secondary | ICD-10-CM

## 2023-05-24 MED ORDER — CLOTRIMAZOLE-BETAMETHASONE 1-0.05 % EX CREA: TOPICAL_CREAM | CUTANEOUS | 0 refills | Status: DC

## 2023-05-24 MED ORDER — METHYLPREDNISOLONE 4 MG PO TBPK: ORAL_TABLET | ORAL | 0 refills | Status: DC

## 2023-05-24 NOTE — ED Provider Notes (Signed)
[facility]    CSN: [id number] Arrival date & time: 05/24/23  1724      History   Chief Complaint Chief Complaint  Patient presents with   Rash    HPI Dylan Huang is a 27 y.o. male presenting for rash of chest.  Rash has been present for the past 2 weeks.  He was seen here 6 days ago and thought to potentially have fungal infection versus contact dermatitis.  Symptoms started around the time he was handling kittens.  Denies history of cat allergy.  Treated with nystatin-triamcinolone cream.  Patient says rash has not gotten any better.  He thinks its gotten a little worse.  Now reports redness of the right eye, occasional sneezing and runny nose.   HPI  History reviewed. No pertinent past medical history.  Patient Active Problem List   Diagnosis Date Noted   OBESITY, NOS 01/25/2007    Past Surgical History:  Procedure Laterality Date   NO PAST SURGERIES         Home Medications    Prior to Admission medications   Medication Sig Start Date End Date Taking? Authorizing Provider  clotrimazole-betamethasone (LOTRISONE) cream Apply to affected area 2 times daily prn 05/24/23  Yes Shirlee Latch, PA-C  methylPREDNISolone (MEDROL DOSEPAK) 4 MG TBPK tablet Take po according to dosepack 05/24/23  Yes Shirlee Latch, PA-C  mirabegron ER (MYRBETRIQ) 25 MG TB24 tablet Take 1 tablet (25 mg total) by mouth daily. 05/20/21   Tommie Sams, DO    Family History Family History  Problem Relation Age of Onset   Healthy Mother    Healthy Father     Social History Social History   Tobacco Use   Smoking status: Never   Smokeless tobacco: Never  Vaping Use   Vaping Use: Never used  Substance Use Topics   Alcohol use: No   Drug use: No     Allergies   Cinnamon and Nickel   Review of Systems Review of Systems  Constitutional:  Negative for fatigue and fever.  HENT:  Positive for rhinorrhea and sneezing. Negative for congestion.   Eyes:  Positive for  redness and itching. Negative for pain.  Respiratory:  Negative for shortness of breath and wheezing.   Skin:  Positive for rash.     Physical Exam Triage Vital Signs ED Triage Vitals  Enc Vitals Group     BP      Pulse      Resp      Temp      Temp src      SpO2      Weight      Height      Head Circumference      Peak Flow      Pain Score      Pain Loc      Pain Edu?      Excl. in GC?    No data found.  Updated Vital Signs BP 118/60   Pulse 62   Temp 98.5 F (36.9 C) (Oral)   SpO2 97%      Physical Exam Vitals and nursing note reviewed.  Constitutional:      General: He is not in acute distress.    Appearance: Normal appearance. He is well-developed.  HENT:     Head: Normocephalic and atraumatic.     Nose: Nose normal.     Mouth/Throat:     Mouth: Mucous membranes are moist.  Pharynx: Oropharynx is clear.  Eyes:     General: No scleral icterus.    Conjunctiva/sclera:     Right eye: Right conjunctiva is injected.  Cardiovascular:     Rate and Rhythm: Normal rate and regular rhythm.  Pulmonary:     Effort: Pulmonary effort is normal. No respiratory distress.     Breath sounds: Normal breath sounds.  Musculoskeletal:     Cervical back: Neck supple.  Skin:    General: Skin is warm and dry.     Capillary Refill: Capillary refill takes less than 2 seconds.     Findings: Rash (tiny papules/erythema of right upper chest) present.  Neurological:     General: No focal deficit present.     Mental Status: He is alert. Mental status is at baseline.     Motor: No weakness.     Gait: Gait normal.  Psychiatric:        Mood and Affect: Mood normal.        Behavior: Behavior normal.      UC Treatments / Results  Labs (all labs ordered are listed, but only abnormal results are displayed) Labs Reviewed - No data to display  EKG   Radiology No results found.  Procedures Procedures (including critical care time)  Medications Ordered in  UC Medications - No data to display  Initial Impression / Assessment and Plan / UC Course  I have reviewed the triage vital signs and the nursing notes.  Pertinent labs & imaging results that were available during my care of the patient were reviewed by me and considered in my medical decision making (see chart for details).   27 year old male presents for rash of right upper chest for the past 2 weeks.  He has been using nystatin-triamcinolone cream over the past 6 days without relief.  He reports right eye redness, nasal congestion, sneezing and occasional runny nose as well.  On exam he has fine maculopapular rash of right chest.  Also has injection of the right conjunctiva without drainage.  Reviewed an image of rash from 6 days ago which did show 1 small area of well-circumscribed lesion which could have been consistent with tinea corporis.  His rash today does not appear this way.  The well-circumscribed lesion has faded.  Higher concern for contact/allergic dermatitis especially since he has what appears to be allergic conjunctivitis and allergy type symptoms.  Could potentially be related to cat allergy.  Switched him to Lotrisone cream and added Medrol Dosepak.  Also suggested he start taking Zyrtec over-the-counter.  Advised if no improvement after the next 6 days or symptoms worsen to call us and we may consider fluconazole 150 mg once weekly x 2 to 4 weeks.  Reviewed return precautions.   Final Clinical Impressions(s) / UC Diagnoses   Final diagnoses:  Rash and nonspecific skin eruption  Allergic conjunctivitis of right eye     Discharge Instructions      -Start new cream and oral corticosteroid. -Start taking Zyrtec over the counter (itchy red and watery eyes, sneezing and congestion sound like allergy related symptoms). Consider also redness relief eye drop. -If no improvement after medrol dosepack please call us and we may consider oral antifungal once weekly x 2-4  weeks.     ED Prescriptions     Medication Sig Dispense Auth. Provider   clotrimazole-betamethasone (LOTRISONE) cream Apply to affected area 2 times daily prn 30 g Nelva Hauk B, PA-C   methylPREDNISolone (MEDROL DOSEPAK) 4 MG TBPK  tablet Take po according to dosepack 21 tablet Shirlee Latch, PA-C      PDMP not reviewed this encounter.   Shirlee Latch, PA-C 05/24/23 [phone]

## 2023-05-24 NOTE — Discharge Instructions (Signed)
-  Start new cream and oral corticosteroid. -Start taking Zyrtec over the counter (itchy red and watery eyes, sneezing and congestion sound like allergy related symptoms). Consider also redness relief eye drop. -If no improvement after medrol dosepack please call us and we may consider oral antifungal once weekly x 2-4 weeks.

## 2023-05-24 NOTE — ED Triage Notes (Addendum)
Pt presents for rash, pt was seen on 05/18/23 and states he doesn't see any improvement and requesting to switch from topical to oral meds. Pt states he noticed new spots in scalp and back.

## 2023-05-26 ENCOUNTER — Telehealth: Payer: Self-pay

## 2023-05-26 NOTE — Telephone Encounter (Signed)
Pt called stating his rash has not improved and is now spreading and would like to have fluconazole sent in. Providers here reviewed his chart and recommend him to be re-seen due to his rash spreading and the current medications not working. Pt verbalized understanding.

## 2023-06-10 ENCOUNTER — Ambulatory Visit
Admission: EM | Admit: 2023-06-10 | Discharge: 2023-06-10 | Disposition: A | Discharge status: Home / Self Care | Payer: BC Managed Care – PPO | Attending: Physician Assistant | Admitting: Physician Assistant

## 2023-06-10 ENCOUNTER — Encounter: Payer: Self-pay | Admitting: Emergency Medicine

## 2023-06-10 DIAGNOSIS — R21 Rash and other nonspecific skin eruption: Secondary | ICD-10-CM | POA: Diagnosis not present

## 2023-06-10 MED ORDER — FLUCONAZOLE 150 MG PO TABS: 150.0000 mg | ORAL_TABLET | ORAL | 0 refills | Status: AC

## 2023-06-10 NOTE — ED Triage Notes (Signed)
Patient in office today c/o rash around neck and chest area x36mo. Have been seen for this issue before 2wks ago. Didn't complete regiment but doesnt seen to think it is working.  [id number]  Denies: fever, N/V

## 2023-06-10 NOTE — ED Provider Notes (Signed)
[facility]    CSN: [id number] Arrival date & time: 06/10/23  1004      History   Chief Complaint Chief Complaint  Patient presents with   Rash    HPI Dylan Huang is a 27 y.o. male.   Patient presents with persistent rash to neck and chest that started about 1 month ago.  He was seen here previously and prescribed topical antifungal with no improvement.  At last visit 2 weeks ago he was started on prednisone Dosepak and advised if no improvement return for evaluation and will trial fluconazole once daily for 3 to 4 weeks.  He reports no improvement with prednisone.  Rash has not worsened since last visit.  He reports some itching.    History reviewed. No pertinent past medical history.  Patient Active Problem List   Diagnosis Date Noted   OBESITY, NOS 01/25/2007    Past Surgical History:  Procedure Laterality Date   NO PAST SURGERIES         Home Medications    Prior to Admission medications   Medication Sig Start Date End Date Taking? Authorizing Provider  fluconazole (DIFLUCAN) 150 MG tablet Take 1 tablet (150 mg total) by mouth once a week for 21 days. 06/10/23 07/01/23 Yes Ward, Tylene Fantasia, PA-C  mirabegron ER (MYRBETRIQ) 25 MG TB24 tablet Take 1 tablet (25 mg total) by mouth daily. 05/20/21   Tommie Sams, DO    Family History Family History  Problem Relation Age of Onset   Healthy Mother    Healthy Father     Social History Social History   Tobacco Use   Smoking status: Never   Smokeless tobacco: Never  Vaping Use   Vaping status: Never Used  Substance Use Topics   Alcohol use: No   Drug use: No     Allergies   Cinnamon and Nickel   Review of Systems Review of Systems  Constitutional:  Negative for chills and fever.  HENT:  Negative for ear pain and sore throat.   Eyes:  Negative for pain and visual disturbance.  Respiratory:  Negative for cough and shortness of breath.   Cardiovascular:  Negative for chest pain and  palpitations.  Gastrointestinal:  Negative for abdominal pain and vomiting.  Genitourinary:  Negative for dysuria and hematuria.  Musculoskeletal:  Negative for arthralgias and back pain.  Skin:  Positive for rash. Negative for color change.  Neurological:  Negative for seizures and syncope.  All other systems reviewed and are negative.    Physical Exam Triage Vital Signs ED Triage Vitals  Encounter Vitals Group     BP 06/10/23 1017 135/78     Systolic BP Percentile --      Diastolic BP Percentile --      Pulse Rate 06/10/23 1017 63     Resp 06/10/23 1017 16     Temp 06/10/23 1017 98.1 F (36.7 C)     Temp Source 06/10/23 1017 Oral     SpO2 06/10/23 1017 95 %     Weight 06/10/23 1014 191 lb (86.6 kg)     Height 06/10/23 1014 6\' 1"  (1.854 m)     Head Circumference --      Peak Flow --      Pain Score 06/10/23 1014 0     Pain Loc --      Pain Education --      Exclude from Growth Chart --    No data found.  Updated  Vital Signs BP 135/78 (BP Location: Left Arm)   Pulse 63   Temp 98.1 F (36.7 C) (Oral)   Resp 16   Ht 6\' 1"  (1.854 m)   Wt 191 lb (86.6 kg)   SpO2 95%   BMI 25.20 kg/m   Visual Acuity Right Eye Distance:   Left Eye Distance:   Bilateral Distance:    Right Eye Near:   Left Eye Near:    Bilateral Near:     Physical Exam Vitals and nursing note reviewed.  Constitutional:      General: He is not in acute distress.    Appearance: He is well-developed.  HENT:     Head: Normocephalic and atraumatic.  Eyes:     Conjunctiva/sclera: Conjunctivae normal.  Cardiovascular:     Rate and Rhythm: Normal rate and regular rhythm.     Heart sounds: No murmur heard. Pulmonary:     Effort: Pulmonary effort is normal. No respiratory distress.     Breath sounds: Normal breath sounds.  Abdominal:     Palpations: Abdomen is soft.     Tenderness: There is no abdominal tenderness.  Musculoskeletal:        General: No swelling.     Cervical back: Neck supple.   Skin:    General: Skin is warm and dry.     Capillary Refill: Capillary refill takes less than 2 seconds.     Comments: Mild discoloration around his neck, area with red papules to right upper chest.  Neurological:     Mental Status: He is alert.  Psychiatric:        Mood and Affect: Mood normal.      UC Treatments / Results  Labs (all labs ordered are listed, but only abnormal results are displayed) Labs Reviewed - No data to display  EKG   Radiology No results found.  Procedures Procedures (including critical care time)  Medications Ordered in UC Medications - No data to display  Initial Impression / Assessment and Plan / UC Course  I have reviewed the triage vital signs and the nursing notes.  Pertinent labs & imaging results that were available during my care of the patient were reviewed by me and considered in my medical decision making (see chart for details).     Rash.  Will trial fluconazole as discussed at last visit.  Discussed with patient need to follow-up with dermatology or PCP if no improvement. Final Clinical Impressions(s) / UC Diagnoses   Final diagnoses:  Rash and nonspecific skin eruption     Discharge Instructions      Take fluconazole once weekly for 3 weeks.  If no improvement follow up with dermatologist    ED Prescriptions     Medication Sig Dispense Auth. Provider   fluconazole (DIFLUCAN) 150 MG tablet Take 1 tablet (150 mg total) by mouth once a week for 21 days. 3 tablet Ward, Tylene Fantasia, PA-C      PDMP not reviewed this encounter.   Ward, Tylene Fantasia, PA-C 06/10/23 1325

## 2023-06-10 NOTE — Discharge Instructions (Signed)
Take fluconazole once weekly for 3 weeks.  If no improvement follow up with dermatologist

## 2023-06-11 ENCOUNTER — Ambulatory Visit: Payer: Self-pay

## 2023-09-10 ENCOUNTER — Emergency Department: Payer: BC Managed Care – PPO

## 2023-09-10 ENCOUNTER — Encounter: Payer: Self-pay | Admitting: Emergency Medicine

## 2023-09-10 ENCOUNTER — Emergency Department
Admission: EM | Admit: 2023-09-10 | Discharge: 2023-09-10 | Disposition: A | Discharge status: Home / Self Care | Payer: BC Managed Care – PPO | Attending: Emergency Medicine | Admitting: Emergency Medicine

## 2023-09-10 ENCOUNTER — Ambulatory Visit
Admission: EM | Admit: 2023-09-10 | Discharge: 2023-09-10 | Disposition: A | Discharge status: Home / Self Care | Payer: BC Managed Care – PPO | Attending: Emergency Medicine | Admitting: Emergency Medicine

## 2023-09-10 ENCOUNTER — Other Ambulatory Visit: Payer: Self-pay

## 2023-09-10 DIAGNOSIS — N50811 Right testicular pain: Secondary | ICD-10-CM | POA: Diagnosis not present

## 2023-09-10 DIAGNOSIS — N5082 Scrotal pain: Secondary | ICD-10-CM | POA: Diagnosis not present

## 2023-09-10 DIAGNOSIS — N503 Cyst of epididymis: Secondary | ICD-10-CM | POA: Diagnosis not present

## 2023-09-10 DIAGNOSIS — R1030 Lower abdominal pain, unspecified: Secondary | ICD-10-CM | POA: Diagnosis not present

## 2023-09-10 LAB — URINALYSIS, ROUTINE W REFLEX MICROSCOPIC
Bilirubin Urine: NEGATIVE
Glucose, UA: NEGATIVE mg/dL
Hgb urine dipstick: NEGATIVE
Ketones, ur: NEGATIVE mg/dL
Leukocytes,Ua: NEGATIVE
Nitrite: NEGATIVE
Protein, ur: NEGATIVE mg/dL
Specific Gravity, Urine: 1.023 (ref 1.005–1.030)
pH: 5 (ref 5.0–8.0)

## 2023-09-10 NOTE — ED Triage Notes (Signed)
Patient c/o right sided groin pain and swelling that started this morning.  Patient denies penile discharge.  Patient denies urinary symptoms.

## 2023-09-10 NOTE — ED Notes (Signed)
Patient is being discharged from the Urgent Care and sent to the Emergency Department via private vehicle . Per Dr. Chaney Malling, patient is in need of higher level of care due to needing Testicular US. Patient is aware and verbalizes understanding of plan of care.  Vitals:   09/10/23 1217  BP: (!) 143/89  Pulse: 66  Resp: 15  Temp: 98 F (36.7 C)  SpO2: 98%

## 2023-09-10 NOTE — ED Triage Notes (Signed)
Pt comes with c/o groin pain. Pt states he woke up this morning with sharp pain. Pt states it went away. Pt states now some discomfort with nausea.

## 2023-09-10 NOTE — ED Provider Notes (Signed)
[facility] Provider Note    Event Date/Time   First MD Initiated Contact with Patient 09/10/23 1357     (approximate)   History   Groin Pain   HPI  Dylan Huang is a 27 y.o. male with no significant past medical history presents emergency department complaining of right testicular pain.  Patient states he woke up this morning with sharp pain.  States then it went away.  Now has discomfort with nausea.  No dysuria.  No concerns for STD.      Physical Exam   Triage Vital Signs: ED Triage Vitals  Encounter Vitals Group     BP 09/10/23 1348 (!) 140/83     Systolic BP Percentile --      Diastolic BP Percentile --      Pulse Rate 09/10/23 1348 61     Resp 09/10/23 1348 18     Temp 09/10/23 1348 98 F (36.7 C)     Temp src --      SpO2 09/10/23 1348 100 %     Weight --      Height --      Head Circumference --      Peak Flow --      Pain Score 09/10/23 1347 3     Pain Loc --      Pain Education --      Exclude from Growth Chart --     Most recent vital signs: Vitals:   09/10/23 1348  BP: (!) 140/83  Pulse: 61  Resp: 18  Temp: 98 F (36.7 C)  SpO2: 100%     General: Awake, no distress.   CV:  Good peripheral perfusion. regular rate and  rhythm Resp:  Normal effort.  Abd:  No distention.   Other:  Testicular exam shows the right testicle and spermatic cord to be slightly tender to palpation, left testicle is nontender, no redness or swelling noted   ED Results / Procedures / Treatments   Labs (all labs ordered are listed, but only abnormal results are displayed) Labs Reviewed  URINALYSIS, ROUTINE W REFLEX MICROSCOPIC - Abnormal; Notable for the following components:      Result Value   Color, Urine YELLOW (*)    APPearance HAZY (*)    All other components within normal limits     EKG     RADIOLOGY Ultrasound scrotum    PROCEDURES:   Procedures   MEDICATIONS ORDERED IN ED: Medications - No data to  display   IMPRESSION / MDM / ASSESSMENT AND PLAN / ED COURSE  I reviewed the triage vital signs and the nursing notes.                              Differential diagnosis includes, but is not limited to, hydrocele, epididymitis, torsion, orchitis  Patient's presentation is most consistent with acute illness / injury with system symptoms.   UA, ultrasound scrotum   UA is reassuring  Care transferred to [employer], PA-C, plan is to await ultrasound result, treat with antibiotics if epididymitis, consult with urology if torsion   FINAL CLINICAL IMPRESSION(S) / ED DIAGNOSES   Final diagnoses:  Testicular pain, right     Rx / DC Orders   ED Discharge Orders     None        Note:  This document was prepared using Dragon voice recognition software and may include unintentional dictation  errors.    Faythe Ghee, PA-C 09/10/23 1514    Loleta Rose, MD 09/10/23 2116

## 2023-09-10 NOTE — Discharge Instructions (Addendum)
You were evaluated in the ED for groin pain.  Your testicular ultrasound is normal.  Incidental finding of a benign cyst was noted.  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  Follow-up with urology symptoms do not improve.  Wear supporting brace and apply warm compresses to the area as needed.  Alternate Tylenol and ibuprofen.

## 2023-09-10 NOTE — ED Provider Notes (Signed)
HPI  SUBJECTIVE:  Dylan Huang is a 27 y.o. male who presents with the acute onset of intermittent sharp alternating with dull right scrotal pain waking him up this morning at 1110 accompanied with nausea.  He reports now a "discomfort" described as a "pulling sensation" in the area.  He reports right testicular swelling, and abnormal lie earlier, which has resolved.  He denies inguinal pain, swelling, testicular pain.  No erythema or swelling in the scrotum.  No vomiting, fevers, urinary complaints.  No penile rash, discharge.  Does not recall any direct trauma to the area, although he did martial arts yesterday.  No abdominal, back pain.  No groin masses.  He has not been sexually active in 2 months.  He has a single male partner, who is asymptomatic.  STDs are not a concern today.  He has not tried anything for this.  No alleviating factors.  Symptoms are worse with sitting down.  Past medical history negative for UTI, epididymitis, orchitis, prostatitis, torsion, gonorrhea, chlamydia, HIV, syphilis or trichomonas.  PCP: None.  History reviewed. No pertinent past medical history.  Past Surgical History:  Procedure Laterality Date   NO PAST SURGERIES      Family History  Problem Relation Age of Onset   Healthy Mother    Healthy Father     Social History   Tobacco Use   Smoking status: Never   Smokeless tobacco: Never  Vaping Use   Vaping status: Never Used  Substance Use Topics   Alcohol use: No   Drug use: No    No current facility-administered medications for this encounter.  Current Outpatient Medications:    mirabegron ER (MYRBETRIQ) 25 MG TB24 tablet, Take 1 tablet (25 mg total) by mouth daily., Disp: 30 tablet, Rfl: 3  Allergies  Allergen Reactions   Cinnamon Other (See Comments)    Other reaction(s): Headache   Nickel Rash     ROS  As noted in HPI.   Physical Exam  BP (!) 143/89 (BP Location: Right Arm)   Pulse 66   Temp 98 F (36.7 C) (Oral)   Resp  15   Ht 6\' 1"  (1.854 m)   Wt 86.2 kg   SpO2 98%   BMI 25.07 kg/m   Constitutional: Well developed, well nourished, no acute distress Eyes:  EOMI, conjunctiva normal bilaterally HENT: Normocephalic, atraumatic,mucus membranes moist Respiratory: Normal inspiratory effort Cardiovascular: Normal rate GI: nondistended GU: Normal uncircumcised male, testes descended bilaterally.  No penile rash, discharge.  Normal testicular lie bilaterally.  No testicular tenderness, swelling bilaterally..  No epididymal tenderness.  Tenderness along the right spermatic cord.  No overlying scrotal erythema, edema.  No inguinal bulging noted with Valsalva.  No inguinal lymphadenopathy.  Negative Prehn sign.  Cremasteric reflex intact bilaterally.  Patient declined chaperone. skin: No rash, skin intact Musculoskeletal: no deformities Neurologic: Alert & oriented x 3, no focal neuro deficits Psychiatric: Speech and behavior appropriate   ED Course   Medications - No data to display  No orders of the defined types were placed in this encounter.   No results found for this or any previous visit (from the past 24 hour(s)). No results found.  ED Clinical Impression  1. Scrotal pain   2. Right testicular pain      ED Assessment/Plan     Patient has several reassuring exam findings, but he does have tenderness along the spermatic cord.  I am concerned about intermittent torsion.  This could be a appendiceal  torsion, epididymitis, varicocele.  I did not appreciate any evidence of hernia.  Unfortunately, ultrasound is not available here.  Transferring to the [facility] emergency department for further evaluation.  He is stable to go via private vehicle.  Discussed rationale for transfer to the emergency department with the patient.  He agrees to go  No orders of the defined types were placed in this encounter.     *This clinic note was created using Dragon dictation software. Therefore, there may be  occasional mistakes despite careful proofreading.  ?    Domenick Gong, MD 09/10/23 1308

## 2023-09-10 NOTE — Discharge Instructions (Addendum)
This could be epididymitis, which is swelling of the epididymis, but I am concerned about testicular torsion.  You do have several reassuring physical exam findings, but physical exam does not necessarily rule in or rule out testicular torsion.  We do not have ultrasound here available today.  Go immediately to the [facility] emergency department, and let them know that we are concerned about torsion.  Here is a list of primary care providers who are taking new patients:  [facility] [place] Dr. Joseph Berkshire (sports medicine) Dr. Elizabeth Sauer [address] [place] [address] [phone]  [facility] at [facility] [address] [place], [place] [address] [phone]  [facility] [place] [address]  [place] [address]  [phone]  [facility] [address] [place], [place] [address] [phone]  [facility] [address] [place]  [phone] [place], [place] [address]  Here are clinics/ other resources who will see you if you do not have insurance. Some have certain criteria that you must meet. Call them and find out what they are:  [facility]: [address], [place], [place] [address] Phone: [phone] Hours: First and Third Saturdays of each Month, 9 a.m. - 1 p.m.  [facility]: [address] [place], [place] [address] Phone: [phone] Hours: Tuesday, 4 p.m. - 8 p.m. Thursday, 1 p.m. - 8 p.m. Wednesday, 9 a.m. - [facility] [address], [place], [place] [address] Phone: [phone] Pharmacy Phone Number: [phone] Dental Phone Number: [phone] [facility] Insurance Help: [phone]  Dental Hours: Monday - Thursday, 8 a.m. - 6 p.m.  [facility] [address], [place], [place] [address] Phone: [phone] Pharmacy Phone Number: [phone] [facility] Insurance Help: [phone]  [facility] [address] [place]  [place], [place] [address] Phone: [phone] Pharmacy Phone Number: [phone] [facility] Insurance Help: [phone]  [facility] [address] [place], [place] [address] Phone: [phone] [facility] Insurance Help: [phone]   [facility] [address] [place], [place] [address] Phone: [phone]  Go to www.goodrx.com  or www.costplusdrugs.com to look up your medications. This will give you a list of where you can find your prescriptions at the most affordable prices. Or ask the pharmacist what the cash price is, or if they have any other discount programs available to help make your medication more affordable. This can be less expensive than what you would pay with insurance.

## 2023-09-10 NOTE — ED Notes (Signed)
US at bedside

## 2023-09-10 NOTE — ED Provider Notes (Signed)
-----------------------------------------   3:19 PM on 09/10/2023 -----------------------------------------  Blood pressure (!) 140/83, pulse 61, temperature 98 F (36.7 C), resp. rate 18, SpO2 100%.  Assuming care from Greig Right, PA-C.  In short, Dylan Huang is a 28 y.o. male with a chief complaint of Groin Pain .  Refer to the original H&P for additional details.  The current plan of care is to await ultrasound results to rule out testicular torsion.  Ultrasound is reassuring.  Negative for testicular torsion.  Negative for epididymitis.  Noted incidental finding of benign epididymal cyst.  Patient is in no pain at this time.  He is in stable condition for discharge home.  Education on brief support and and warm compress to the area provided.  Urology follow-up encouraged if symptoms do not improve or pain returns.     Romeo Apple, Lollie Gunner A, PA-C 09/10/23 1623    Loleta Rose, MD 09/10/23 2116

## 2023-09-12 ENCOUNTER — Emergency Department
Admission: EM | Admit: 2023-09-12 | Discharge: 2023-09-12 | Disposition: A | Discharge status: Home / Self Care | Payer: BC Managed Care – PPO | Attending: Emergency Medicine | Admitting: Emergency Medicine

## 2023-09-12 ENCOUNTER — Other Ambulatory Visit: Payer: Self-pay

## 2023-09-12 ENCOUNTER — Emergency Department: Payer: BC Managed Care – PPO

## 2023-09-12 DIAGNOSIS — R202 Paresthesia of skin: Secondary | ICD-10-CM | POA: Diagnosis not present

## 2023-09-12 DIAGNOSIS — R42 Dizziness and giddiness: Secondary | ICD-10-CM | POA: Insufficient documentation

## 2023-09-12 DIAGNOSIS — R1031 Right lower quadrant pain: Secondary | ICD-10-CM | POA: Diagnosis not present

## 2023-09-12 DIAGNOSIS — R209 Unspecified disturbances of skin sensation: Secondary | ICD-10-CM | POA: Diagnosis not present

## 2023-09-12 LAB — COMPREHENSIVE METABOLIC PANEL
ALT: 24 U/L (ref 0–44)
AST: 24 U/L (ref 15–41)
Albumin: 4.7 g/dL (ref 3.5–5.0)
Alkaline Phosphatase: 87 U/L (ref 38–126)
Anion gap: 9 (ref 5–15)
BUN: 20 mg/dL (ref 6–20)
CO2: 27 mmol/L (ref 22–32)
Calcium: 9.4 mg/dL (ref 8.9–10.3)
Chloride: 101 mmol/L (ref 98–111)
Creatinine, Ser: 1.05 mg/dL (ref 0.61–1.24)
GFR, Estimated: >60 mL/min (ref >60)
Glucose, Bld: 104 mg/dL — ABNORMAL HIGH (ref 70–99)
Potassium: 4.4 mmol/L (ref 3.5–5.1)
Sodium: 137 mmol/L (ref 135–145)
Total Bilirubin: 1.3 mg/dL — ABNORMAL HIGH (ref 0.3–1.2)
Total Protein: 8.1 g/dL (ref 6.5–8.1)

## 2023-09-12 LAB — CBC WITH DIFFERENTIAL/PLATELET
Abs Immature Granulocytes: 0.01 10*3/uL (ref 0.00–0.07)
Basophils Absolute: 0.1 10*3/uL (ref 0.0–0.1)
Basophils Relative: 1 %
Eosinophils Absolute: 0.4 10*3/uL (ref 0.0–0.5)
Eosinophils Relative: 7 %
HCT: 46.3 % (ref 39.0–52.0)
Hemoglobin: 15.3 g/dL (ref 13.0–17.0)
Immature Granulocytes: 0 %
Lymphocytes Relative: 33 %
Lymphs Abs: 2.1 10*3/uL (ref 0.7–4.0)
MCH: 29.3 pg (ref 26.0–34.0)
MCHC: 33 g/dL (ref 30.0–36.0)
MCV: 88.7 fL (ref 80.0–100.0)
Monocytes Absolute: 0.5 10*3/uL (ref 0.1–1.0)
Monocytes Relative: 7 %
Neutro Abs: 3.4 10*3/uL (ref 1.7–7.7)
Neutrophils Relative %: 52 %
Platelets: 318 10*3/uL (ref 150–400)
RBC: 5.22 MIL/uL (ref 4.22–5.81)
RDW: 12.9 % (ref 11.5–15.5)
WBC: 6.5 10*3/uL (ref 4.0–10.5)
nRBC: 0 % (ref 0.0–0.2)

## 2023-09-12 LAB — LIPASE, BLOOD: Lipase: 40 U/L (ref 11–51)

## 2023-09-12 LAB — URINALYSIS, ROUTINE W REFLEX MICROSCOPIC
Bilirubin Urine: NEGATIVE
Glucose, UA: NEGATIVE mg/dL
Hgb urine dipstick: NEGATIVE
Ketones, ur: NEGATIVE mg/dL
Leukocytes,Ua: NEGATIVE
Nitrite: NEGATIVE
Protein, ur: NEGATIVE mg/dL
Specific Gravity, Urine: >1.046 — ABNORMAL HIGH (ref 1.005–1.030)
pH: 7 (ref 5.0–8.0)

## 2023-09-12 MED ORDER — IOHEXOL 300 MG/ML  SOLN
100.0000 mL | Freq: Once | INTRAMUSCULAR | Status: AC | PRN
  Administered 2023-09-12: 100 mL via INTRAVENOUS

## 2023-09-12 NOTE — ED Notes (Signed)
See triage notes. Patient c/o lower back discomfort with tingling in both legs. Patient found a spider in his bed last night and is concerned it could be a spider bite. Patient dx with a cyst to his scrotum on 10/13

## 2023-09-12 NOTE — ED Provider Notes (Signed)
[facility] Provider Note    Event Date/Time   First MD Initiated Contact with Patient 09/12/23 1154     (approximate)   History   Tingling   HPI  JOHANNES EVERAGE is a 27 y.o. male with no PMH presents for evaluation of ongoing right sided groin pain and new onset tingling sensation of bilateral legs that started yesterday.  Patient was seen by this ED for right groin pain 2 days ago, and had a testicular ultrasound done which showed a cyst.  He stated that yesterday he started to have tingling in his bilateral legs which then spread to the rest of his body.  He reports some dizziness and describes it as more of a lightheadedness.  He also reports headaches that began and resolved this morning.      Physical Exam   Triage Vital Signs: ED Triage Vitals  Encounter Vitals Group     BP 09/12/23 1122 (!) 140/87     Systolic BP Percentile --      Diastolic BP Percentile --      Pulse Rate 09/12/23 1122 71     Resp 09/12/23 1122 18     Temp 09/12/23 1120 97.8 F (36.6 C)     Temp src --      SpO2 09/12/23 1122 100 %     Weight 09/12/23 1122 189 lb 9.5 oz (86 kg)     Height 09/12/23 1122 6\' 1"  (1.854 m)     Head Circumference --      Peak Flow --      Pain Score 09/12/23 1122 3     Pain Loc --      Pain Education --      Exclude from Growth Chart --     Most recent vital signs: Vitals:   09/12/23 1122 09/12/23 1525  BP: (!) 140/87 (!) 122/56  Pulse: 71 61  Resp: 18 18  Temp:  97.7 F (36.5 C)  SpO2: 100% 98%   General: Awake, no distress.  CV:  Good peripheral perfusion.  RRR. Resp:  Normal effort.  CTAB. Abd:  No distention.  Tender to palpation suprapubically, otherwise soft, no masses. Other:  PERRL.  EOM intact.  No ataxia.  Bilateral upper and lower extremities 5/5 strength.  Sensation intact across all dermatomes.  Unable to elicit patellar tendon reflex.   ED Results / Procedures / Treatments   Labs (all labs ordered are listed,  but only abnormal results are displayed) Labs Reviewed  COMPREHENSIVE METABOLIC PANEL - Abnormal; Notable for the following components:      Result Value   Glucose, Bld 104 (*)    Total Bilirubin 1.3 (*)    All other components within normal limits  URINALYSIS, ROUTINE W REFLEX MICROSCOPIC - Abnormal; Notable for the following components:   Color, Urine YELLOW (*)    APPearance CLEAR (*)    Specific Gravity, Urine >1.046 (*)    All other components within normal limits  LIPASE, BLOOD  CBC WITH DIFFERENTIAL/PLATELET    RADIOLOGY  CT abdomen pelvis obtained, interpreted the images as well as reviewed the radiologist report.  Scan was negative for any acute intra-abdominal pathologies.  PROCEDURES:  Critical Care performed: No  Procedures   MEDICATIONS ORDERED IN ED: Medications  iohexol (OMNIPAQUE) 300 MG/ML solution 100 mL (100 mLs Intravenous Contrast Given 09/12/23 1309)     IMPRESSION / MDM / ASSESSMENT AND PLAN / ED COURSE  I reviewed the triage vital  signs and the nursing notes.                             27 year old male presents for evaluation of tingling in the extremities as well as ongoing groin pain.  Vital signs were stable in triage and patient was NAD on exam.  Differential diagnosis includes, but is not limited to, cauda equina syndrome, cord compression, discitis, demyelinating disease, Guillain-Barr, stroke, myositis.  Patient's presentation is most consistent with acute complicated illness / injury requiring diagnostic workup.  Urinalysis is unremarkable. CMP, lipase and CBC are also unremarkable.  CT abdomen pelvis obtained to evaluate for potential causes of patient's groin pain beyond the already known cyst as well as an explanation for his paresthesias.  I interpreted the images as well as reviewed the radiologist report which was negative and did not show any acute intra-abdominal pathologies.  Although I am not exactly sure what was causing his  paresthesias, I have very low suspicion for cauda equina, demyelinating syndrome, Guillain-Barr, discitis, stroke and myositis as patient did not have any neurodeficits.  He denies any urinary retention and saddle anesthesia.  He does not have any weakness and his sensation was intact across all dermatomes.  During patient's ED visit, he reported an improvement of his symptoms states that he is no longer having paresthesias.  Given his reassuring imaging, blood work, urinalysis and resolution of his symptoms I feel he stable for outpatient management with follow-up to urology and primary care.     FINAL CLINICAL IMPRESSION(S) / ED DIAGNOSES   Final diagnoses:  Tingling in extremities     Rx / DC Orders   ED Discharge Orders     None        Note:  This document was prepared using Dragon voice recognition software and may include unintentional dictation errors.   Cameron Ali, PA-C 09/12/23 1609    Corena Herter, MD 09/13/23 1517

## 2023-09-12 NOTE — ED Provider Notes (Signed)
Shared visit  Patient was evaluated recently in the emergency department for testicular pain.  Patient states that he had ongoing pain today to his lower abdomen.  Also with intermittent episodes of tingling sensation to both of his legs and his arms.  Felt like he was having lower back pain.  States that he got up from bed and felt like he was dizzy and having a shooting pain down his legs which concerned him.  Lab work with no significant electrolyte abnormalities.  CT scan abdomen and pelvis was ordered for further evaluation.  Patient denies any IV drug use.  No fever or chills.  On my exam has a normal neurologic exam.  No dysmetria.  Normal gait.  No nystagmus.  Sensation intact and equal in all extremities.  Cranial nerves intact.  Abdomen is nontender.  No CVA tenderness.  Clinical picture is not consistent with meningitis, no concern for CVA or TIA.  Normal electrolytes.  No lower extremity weakness, doubt Guillain-Barr syndrome.  No concern for transverse myelitis given his normal neurologic exam.  No red flag for back pain, no IV drug use or concern for discitis.  Patient's symptoms have resolved, possible some component of stress/anxiety.  If CT scan reassuring plan to discharge home with follow-up as an outpatient with primary care provider and urology.   Corena Herter, MD 09/12/23 1504

## 2023-09-12 NOTE — ED Triage Notes (Signed)
Pt to ED for contined right sided groin pain, seen on 10/13 for same. Reports  tingling sensation to bilateral legs started yesterday, reports sensation has improved since last night. NAD noted, ambulatory to triage Reports dx with cyst to scrotum   Also reports could have been bit by spider, found a spider in bed last night.

## 2023-09-12 NOTE — Discharge Instructions (Addendum)
Your blood work, urinalysis and CT scan were all normal today.  Please follow-up with urology regarding the cyst.  You can return to the ED with any new or worsening symptoms.

## 2023-09-14 ENCOUNTER — Telehealth: Payer: Self-pay

## 2023-09-14 ENCOUNTER — Ambulatory Visit
Admission: EM | Admit: 2023-09-14 | Discharge: 2023-09-14 | Disposition: A | Discharge status: Home / Self Care | Payer: BC Managed Care – PPO | Attending: Physician Assistant | Admitting: Physician Assistant

## 2023-09-14 DIAGNOSIS — R42 Dizziness and giddiness: Secondary | ICD-10-CM

## 2023-09-14 DIAGNOSIS — R350 Frequency of micturition: Secondary | ICD-10-CM | POA: Diagnosis not present

## 2023-09-14 DIAGNOSIS — R519 Headache, unspecified: Secondary | ICD-10-CM | POA: Diagnosis not present

## 2023-09-14 DIAGNOSIS — R1012 Left upper quadrant pain: Secondary | ICD-10-CM

## 2023-09-14 DIAGNOSIS — Z72820 Sleep deprivation: Secondary | ICD-10-CM

## 2023-09-14 DIAGNOSIS — R1030 Lower abdominal pain, unspecified: Secondary | ICD-10-CM | POA: Diagnosis not present

## 2023-09-14 DIAGNOSIS — G4700 Insomnia, unspecified: Secondary | ICD-10-CM

## 2023-09-14 DIAGNOSIS — Z91048 Other nonmedicinal substance allergy status: Secondary | ICD-10-CM | POA: Diagnosis not present

## 2023-09-14 DIAGNOSIS — Z881 Allergy status to other antibiotic agents status: Secondary | ICD-10-CM | POA: Diagnosis not present

## 2023-09-14 DIAGNOSIS — R202 Paresthesia of skin: Secondary | ICD-10-CM

## 2023-09-14 DIAGNOSIS — G43011 Migraine without aura, intractable, with status migrainosus: Secondary | ICD-10-CM

## 2023-09-14 DIAGNOSIS — R200 Anesthesia of skin: Secondary | ICD-10-CM | POA: Diagnosis not present

## 2023-09-14 DIAGNOSIS — N503 Cyst of epididymis: Secondary | ICD-10-CM | POA: Diagnosis not present

## 2023-09-14 DIAGNOSIS — R110 Nausea: Secondary | ICD-10-CM | POA: Diagnosis not present

## 2023-09-14 DIAGNOSIS — Z91018 Allergy to other foods: Secondary | ICD-10-CM | POA: Diagnosis not present

## 2023-09-14 MED ORDER — HYDROXYZINE HCL 25 MG PO TABS
ORAL_TABLET | ORAL | 0 refills | Status: DC
Start: 2023-09-14 — End: 2023-11-01

## 2023-09-14 NOTE — Telephone Encounter (Signed)
Pt.'s mother calling for new patient appointment today at [facility] or Primary Care and Sports Medicine at [facility]. No availability. Declines to set up future appointment. "We'll something else."

## 2023-09-14 NOTE — ED Provider Notes (Signed)
[facility]    CSN: [id number] Arrival date & time: 09/14/23  1745      History   Chief Complaint Chief Complaint  Patient presents with   LUQ pain   Extremity Weakness    HPI Dylan Huang is a 27 y.o. male.   27 year old male accompanied by his Mom with concern over headache and chest pain that started today. Was first seen here at Urgent Care on 10/13 for Scrotal pain and concern over torsion. Was sent to the ER [facility]) that day for ultrasound and management. Ultrasound was negative for torsion and UA was also negative. Patient discharged and pain was resolving. He then returned to the ER [facility]) on 10/15 with continued lower abdominal pain and bilateral lower and upper extremity tingling with occasional back pain. Also some dizziness. He had blood work performed in which CBC, Chemistry and UA were all unremarkable. Also had CT scan performed which was normal. Symptoms had improved and he was discharged to home. He was told to follow-up with a PCP and earliest appointment is in 3 months. He continued to experience occasional migraine headache in which he usually takes Tylenol. He also has not been sleeping over the past 4 to 5 days and has had a total of about 8 hours of sleep in the past 96 hours. Today, he was concerned over persistent headache and elevated blood pressure so he went to [facility] in [place] this morning. He had repeat CBC, Chemistry profile and UA which were again, unremarkable. He had an ECG done but not interpreted (was told it would be 24 hours for results). They gave him a shot of Toradol for his headache, his blood pressure was normal and discharged him home. They also prescribed Hydroxyzine but patient has not picked up Rx yet. Within a couple hours of Toradol injection, he started having left upper quadrant abdominal pain, increased tingling in both upper and lower extremities, especially his left arm and some dizziness so he came here to Urgent  Care. No tobacco, alcohol or illicit drug use. No other persistent chronic health issues except migraine headaches. No current daily medication.   The history is provided by the patient and a parent.    History reviewed. No pertinent past medical history.  Patient Active Problem List   Diagnosis Date Noted   OBESITY, NOS 01/25/2007    Past Surgical History:  Procedure Laterality Date   NO PAST SURGERIES         Home Medications    Prior to Admission medications   Medication Sig Start Date End Date Taking? Authorizing Provider  hydrOXYzine (ATARAX) 25 MG tablet Take 1/2 to 1 whole tablet by mouth every 8 hours as needed. 09/14/23  Yes Layann Bluett, Ali Lowe, NP    Family History Family History  Problem Relation Age of Onset   Healthy Mother    Healthy Father     Social History Social History   Tobacco Use   Smoking status: Never   Smokeless tobacco: Never  Vaping Use   Vaping status: Never Used  Substance Use Topics   Alcohol use: No   Drug use: No     Allergies   Doxycycline, Cinnamon, and Nickel   Review of Systems Review of Systems  Constitutional:  Positive for fatigue. Negative for appetite change, chills, diaphoresis and fever.  HENT:  Negative for congestion, ear pain and trouble swallowing.   Eyes:  Negative for photophobia, pain and visual disturbance.  Respiratory:  Positive for chest tightness. Negative for cough, shortness of breath and wheezing.   Cardiovascular:  Positive for chest pain. Negative for palpitations.  Gastrointestinal:  Positive for abdominal pain (left upper quadrant). Negative for nausea and vomiting.  Genitourinary:  Negative for difficulty urinating and hematuria.  Musculoskeletal:  Positive for arthralgias and back pain. Negative for neck pain and neck stiffness.  Skin:  Negative for color change and rash.  Allergic/Immunologic: Positive for environmental allergies and food allergies. Negative for immunocompromised state.   Neurological:  Positive for light-headedness, numbness and headaches. Negative for tremors, seizures, syncope, facial asymmetry and speech difficulty.  Hematological:  Negative for adenopathy. Does not bruise/bleed easily.  Psychiatric/Behavioral:  Positive for sleep disturbance.      Physical Exam Triage Vital Signs ED Triage Vitals  Encounter Vitals Group     BP 09/14/23 1801 139/67     Systolic BP Percentile --      Diastolic BP Percentile --      Pulse Rate 09/14/23 1801 62     Resp 09/14/23 1801 (!) 22     Temp 09/14/23 1801 98.7 F (37.1 C)     Temp Source 09/14/23 1801 Oral     SpO2 09/14/23 1801 100 %     Weight --      Height --      Head Circumference --      Peak Flow --      Pain Score 09/14/23 1800 5     Pain Loc --      Pain Education --      Exclude from Growth Chart --    No data found.  Updated Vital Signs BP 139/67 (BP Location: Left Arm)   Pulse 62   Temp 98.7 F (37.1 C) (Oral)   Resp (!) 22   SpO2 100%   Visual Acuity Right Eye Distance:   Left Eye Distance:   Bilateral Distance:    Right Eye Near:   Left Eye Near:    Bilateral Near:     Physical Exam Vitals and nursing note reviewed.  Constitutional:      General: He is awake. He is not in acute distress.    Appearance: He is well-developed.     Comments: He is sitting on the exam table in no acute distress but is holding his left upper abdominal area and appears tired.   HENT:     Head: Normocephalic and atraumatic.     Right Ear: Hearing, tympanic membrane, ear canal and external ear normal.     Left Ear: Hearing, tympanic membrane, ear canal and external ear normal.     Nose: Nose normal.     Mouth/Throat:     Lips: Pink.     Mouth: Mucous membranes are moist.     Pharynx: Oropharynx is clear. Uvula midline.  Eyes:     Extraocular Movements: Extraocular movements intact.     Conjunctiva/sclera: Conjunctivae normal.     Pupils: Pupils are equal, round, and reactive to light.   Cardiovascular:     Rate and Rhythm: Normal rate and regular rhythm.     Heart sounds: Normal heart sounds. No murmur heard. Pulmonary:     Effort: Pulmonary effort is normal. No accessory muscle usage or respiratory distress.     Breath sounds: Normal breath sounds and air entry. No decreased air movement. No decreased breath sounds, wheezing, rhonchi or rales.     Comments: Respiratory rate = 18 during exam Abdominal:  General: Abdomen is flat. Bowel sounds are normal. There is no distension.     Palpations: Abdomen is soft. There is no hepatomegaly or splenomegaly.     Tenderness: There is abdominal tenderness in the left upper quadrant. There is no right CVA tenderness, left CVA tenderness, guarding or rebound.    Musculoskeletal:        General: Normal range of motion.     Cervical back: Normal range of motion and neck supple. No rigidity.  Lymphadenopathy:     Cervical: No cervical adenopathy.  Skin:    General: Skin is warm and dry.     Capillary Refill: Capillary refill takes less than 2 seconds.     Findings: No rash.  Neurological:     General: No focal deficit present.     Mental Status: He is alert and oriented to person, place, and time.     Cranial Nerves: Cranial nerves 2-12 are intact.     Sensory: Sensation is intact. No sensory deficit.     Motor: Motor function is intact.     Comments: Good strength and sensation in upper and lower extremities.   Psychiatric:        Attention and Perception: Attention normal.        Mood and Affect: Mood and affect normal.        Speech: Speech normal.        Behavior: Behavior is cooperative.        Thought Content: Thought content normal.        Cognition and Memory: Cognition normal.        Judgment: Judgment normal.      UC Treatments / Results  Labs (all labs ordered are listed, but only abnormal results are displayed) Labs Reviewed - No data to display  EKG   Radiology No results found.  Procedures ED  EKG  Date/Time: 09/14/2023 6:01 PM  Performed by: Sudie Grumbling, NP Authorized by: Shirlee Latch, PA-C   ECG interpreted by ED Physician in the absence of a cardiologist: no   Previous ECG:    Previous ECG:  Compared to current (previous ECG is over 15 years ago)   Similarity:  No change   Comparison ECG info:  No significant changes noted. Interpretation:    Interpretation: normal   Rate:    ECG rate:  65   ECG rate assessment: normal   Rhythm:    Rhythm: sinus rhythm   Ectopy:    Ectopy: none   QRS:    QRS axis:  Normal ST segments:    ST segments:  Normal T waves:    T waves: normal   Q waves:    Abnormal Q-waves: not present   Comments:     Athena Masse, PA also reviewed ECG and no distinct concerning findings present.  Patient had ECG done earlier today at [facility] in [place] but no image or interpretation is available at this time.   (including critical care time)  Medications Ordered in UC Medications - No data to display  Initial Impression / Assessment and Plan / UC Course  I have reviewed the triage vital signs and the nursing notes.  Pertinent labs & imaging results that were available during my care of the patient were reviewed by me and considered in my medical decision making (see chart for details).     Reviewed ECG findings with patient. No distinct abnormalities detected. Patient indicated chest pain had resolved but still has  some intermittent tingling sensation in both upper and lower extremities. Reviewed that tingling sensation and chest pain may be a side effect of Toradol injection. Also reviewed that his intermittent parasthesis and pain may be due to sleep deprivation or anxiety. Blood work done earlier at [facility] was reassuring and we did not repeat blood work today. Patient is stable with no emergent findings. Discussed that he may need additional work up and management of his migraine headaches and symptoms and will  need to see a PCP or Neurologist. Since unable to pick up Rx for Hydroxyzine from [facility] due to time (original pharmacy closed)- will prescribe Hydroxyzine 25mg - take 1/2 to 1 whole tablet every 8 hours as needed to try to help with sleep- start this evening. May continue OTC Tylenol 1000mg  every 8 hours as needed for headache or pain. Continue to push fluids. If abdominal or chest pain gets worse or if numbness gets worse or headache does not resolve, go to the ER ASAP. Otherwise, follow-up with a PCP as recommended.   Final Clinical Impressions(s) / UC Diagnoses   Final diagnoses:  Abdominal pain, left upper quadrant  Numbness and tingling in left arm  Numbness and tingling of both lower extremities  Sleep deprivation  Intractable migraine without aura and with status migrainosus  Lightheadedness     Discharge Instructions      Recommend start Hydroxyzine 25mg - take 1/2 to 1 whole tablet every 8 hours as needed to try to help with sleep. May continue OTC Tylenol 1000mg  every 8 hours as needed for headache and pain. Continue to push fluids. If pain continues or gets worse or if numbness gets worse, go to the ER ASAP. Otherwise, follow-up with a PCP as planned.     ED Prescriptions     Medication Sig Dispense Auth. Provider   hydrOXYzine (ATARAX) 25 MG tablet Take 1/2 to 1 whole tablet by mouth every 8 hours as needed. 15 tablet Carlina Derks, Ali Lowe, NP      PDMP not reviewed this encounter.   Sudie Grumbling, NP 09/15/23 1148

## 2023-09-14 NOTE — Discharge Instructions (Signed)
Recommend start Hydroxyzine 25mg - take 1/2 to 1 whole tablet every 8 hours as needed to try to help with sleep. May continue OTC Tylenol 1000mg  every 8 hours as needed for headache and pain. Continue to push fluids. If pain continues or gets worse or if numbness gets worse, go to the ER ASAP. Otherwise, follow-up with a PCP as planned.

## 2023-09-14 NOTE — ED Triage Notes (Signed)
Patient states that he went to [facility] for hypertension sx and headache. He got a shot of Toradol at 2:15 and is now having chest pains and left arm tingling. EKG is done.

## 2023-09-17 DIAGNOSIS — G4700 Insomnia, unspecified: Secondary | ICD-10-CM | POA: Diagnosis not present

## 2023-09-17 DIAGNOSIS — E663 Overweight: Secondary | ICD-10-CM | POA: Diagnosis not present

## 2023-09-17 DIAGNOSIS — N503 Cyst of epididymis: Secondary | ICD-10-CM | POA: Diagnosis not present

## 2023-09-18 NOTE — Plan of Care (Signed)
[facility] Tonsillectomy/Adenoidectomy, Postoperative PEDS care plan entered in error.

## 2023-09-19 ENCOUNTER — Emergency Department: Payer: BC Managed Care – PPO

## 2023-09-19 ENCOUNTER — Other Ambulatory Visit: Payer: Self-pay

## 2023-09-19 ENCOUNTER — Emergency Department
Admission: EM | Admit: 2023-09-19 | Discharge: 2023-09-19 | Disposition: A | Discharge status: Home / Self Care | Payer: BC Managed Care – PPO | Attending: Emergency Medicine | Admitting: Emergency Medicine

## 2023-09-19 DIAGNOSIS — R1031 Right lower quadrant pain: Secondary | ICD-10-CM | POA: Diagnosis not present

## 2023-09-19 DIAGNOSIS — N5089 Other specified disorders of the male genital organs: Secondary | ICD-10-CM | POA: Diagnosis not present

## 2023-09-19 DIAGNOSIS — N50811 Right testicular pain: Secondary | ICD-10-CM | POA: Insufficient documentation

## 2023-09-19 LAB — CHLAMYDIA/NGC RT PCR (ARMC ONLY)
Chlamydia Tr: NOT DETECTED
N gonorrhoeae: NOT DETECTED

## 2023-09-19 LAB — URINALYSIS, ROUTINE W REFLEX MICROSCOPIC
Bilirubin Urine: NEGATIVE
Glucose, UA: NEGATIVE mg/dL
Hgb urine dipstick: NEGATIVE
Ketones, ur: NEGATIVE mg/dL
Leukocytes,Ua: NEGATIVE
Nitrite: NEGATIVE
Protein, ur: NEGATIVE mg/dL
Specific Gravity, Urine: 1.018 (ref 1.005–1.030)
pH: 5 (ref 5.0–8.0)

## 2023-09-19 NOTE — ED Triage Notes (Signed)
Pt to ED for right groin pain started this am. Burning with urination. +swelling.  Seen on 10/13 for similar sx and had [place]

## 2023-09-19 NOTE — ED Provider Triage Note (Addendum)
Emergency Medicine Provider Triage Evaluation Note  Dylan Huang , a 27 y.o. male  was evaluated in triage.  Pt complains of right-sided groin pain.  Patient had same symptoms and was seen on 09/14/2023.  States symptoms are returned.  Pain radiates from the pelvic area into the right testicle.  Some burning or difficulty with urination.  No fever or chills.  No recent sexual activity.  Had negative STD test, negative ultrasound and negative CT, has been seen multiple times in the ED over the past month.  Review of Systems  Positive:  Negative:   Physical Exam  BP 120/73   Pulse 68   Temp 97.8 F (36.6 C)   Resp 18   Ht 6\' 1"  (1.854 m)   Wt 86 kg   SpO2 100%   BMI 25.01 kg/m  Gen:   Awake, no distress  Resp:  Normal effort  MSK:   Moves extremities without difficulty  Other:    Medical Decision Making  Medically screening exam initiated at 8:00 AM.  Appropriate orders placed.  Dylan Huang was informed that the remainder of the evaluation will be completed by another provider, this initial triage assessment does not replace that evaluation, and the importance of remaining in the ED until their evaluation is complete.     Faythe Ghee, PA-C 09/19/23 0801    Faythe Ghee, PA-C 09/19/23 [phone]

## 2023-09-19 NOTE — ED Notes (Signed)
Patient transported to Ultrasound 

## 2023-09-19 NOTE — ED Provider Notes (Signed)
[facility] Provider Note    Event Date/Time   First MD Initiated Contact with Patient 09/19/23 0813     (approximate)   History   Groin Pain   HPI  Dylan Huang is a 27 y.o. male who comes in with right sided groin pain.  Patient reports he is not sexually active.  Patient reports having right sided testicle pain that started this morning.  When asked him if he is had this previously he stated no.  I asked him about the visits back in the beginning of October and he states he will that was just more of a dull ache and this is more of a sharp pain.  He states that he had a friend who had testicular torsion and he want to make sure that he was not having that today.  I reviewed his prior visits where he had ultrasound that was negative as well as a CT scan that did not show any appendicitis or any signs of kidney stones.  Repeat ultrasound was ordered today from triage.  He denies any new sexual contacts.  Physical Exam   Triage Vital Signs: ED Triage Vitals  Encounter Vitals Group     BP 09/19/23 0754 120/73     Systolic BP Percentile --      Diastolic BP Percentile --      Pulse Rate 09/19/23 0754 68     Resp 09/19/23 0754 18     Temp 09/19/23 0754 97.8 F (36.6 C)     Temp src --      SpO2 09/19/23 0754 100 %     Weight 09/19/23 0755 189 lb 9.5 oz (86 kg)     Height 09/19/23 0755 6\' 1"  (1.854 m)     Head Circumference --      Peak Flow --      Pain Score 09/19/23 0755 5     Pain Loc --      Pain Education --      Exclude from Growth Chart --     Most recent vital signs: Vitals:   09/19/23 0754  BP: 120/73  Pulse: 68  Resp: 18  Temp: 97.8 F (36.6 C)  SpO2: 100%     General: Awake, no distress.  CV:  Good peripheral perfusion.  Resp:  Normal effort.  Abd:  No distention.  Soft and nontender abdomen.  No CVA tenderness.  Testicles were examined with chaperone in the room and he is good and normal testicles without any significant  swelling or redness. Other:     ED Results / Procedures / Treatments   Labs (all labs ordered are listed, but only abnormal results are displayed) Labs Reviewed  URINALYSIS, ROUTINE W REFLEX MICROSCOPIC - Abnormal; Notable for the following components:      Result Value   Color, Urine STRAW (*)    APPearance CLEAR (*)    All other components within normal limits     RADIOLOGY I have reviewed the ultrasound personally interpreted no evidence of testicular torsion   PROCEDURES:  Critical Care performed: No  Procedures   MEDICATIONS ORDERED IN ED: Medications - No data to display   IMPRESSION / MDM / ASSESSMENT AND PLAN / ED COURSE  I reviewed the triage vital signs and the nursing notes.   Patient's presentation is most consistent with acute presentation with potential threat to life or bodily function.   Differential is orchitis, epididymitis, testicular torsion.  Ultrasound is pending.  Examination is reassuring.  Offered patient some Tylenol or ibuprofen and he declined.  We discussed follow-up with urology if ultrasound is negative.  Doubt kidney stone or appendicitis given recent CT imaging was negative.  G/c negatie  UA negative  IMPRESSION: Preserved testicular echotexture and blood flow on Doppler.   Discussed with patient reassuring workup and he feels comfortable with discharge home and will follow-up with urology outpatient.  I reviewed patient's office visit from [facility] on 09/14/2023    FINAL CLINICAL IMPRESSION(S) / ED DIAGNOSES   Final diagnoses:  Pain in right testicle     Rx / DC Orders   ED Discharge Orders     None        Note:  This document was prepared using Dragon voice recognition software and may include unintentional dictation errors.   Concha Se, MD 09/19/23 1228

## 2023-09-19 NOTE — Discharge Instructions (Addendum)
Take ibuprofen 600 every 6-8 hours with food and call the urologist to make a follow-up appointment.  Return for fevers worsening pain or any other concerns

## 2023-09-21 DIAGNOSIS — R0601 Orthopnea: Secondary | ICD-10-CM | POA: Diagnosis not present

## 2023-09-21 DIAGNOSIS — N509 Disorder of male genital organs, unspecified: Secondary | ICD-10-CM | POA: Diagnosis not present

## 2023-09-21 DIAGNOSIS — Z0001 Encounter for general adult medical examination with abnormal findings: Secondary | ICD-10-CM | POA: Diagnosis not present

## 2023-09-21 DIAGNOSIS — R1312 Dysphagia, oropharyngeal phase: Secondary | ICD-10-CM | POA: Diagnosis not present

## 2023-09-21 DIAGNOSIS — Z131 Encounter for screening for diabetes mellitus: Secondary | ICD-10-CM | POA: Diagnosis not present

## 2023-09-26 ENCOUNTER — Encounter: Payer: Self-pay | Admitting: Urology

## 2023-09-26 ENCOUNTER — Ambulatory Visit: Payer: BC Managed Care – PPO | Admitting: Urology

## 2023-09-26 VITALS — BP 115/58 | HR 59 | Ht 72.0 in | Wt 189.0 lb

## 2023-09-26 DIAGNOSIS — R1031 Right lower quadrant pain: Secondary | ICD-10-CM | POA: Diagnosis not present

## 2023-09-26 DIAGNOSIS — M6289 Other specified disorders of muscle: Secondary | ICD-10-CM

## 2023-09-26 MED ORDER — CELECOXIB 200 MG PO CAPS: 200.0000 mg | ORAL_CAPSULE | Freq: Two times a day (BID) | ORAL | 0 refills | Status: DC

## 2023-09-26 NOTE — Patient Instructions (Signed)
Pelvic Floor Dysfunction, Male     Pelvic floor dysfunction (PFD) is a condition that results when the group of muscles and connective tissues that support the organs in the pelvis (pelvic floor muscles) do not work well. These muscles and their connections form a sling that supports the colon and bladder. In men, these muscles also support the prostate gland. PFD causes pelvic floor muscles to be too weak, too tight, or both. In PFD, muscle movements are not coordinated. This may cause bowel or bladder problems. It may also cause pain. What are the causes? This condition may be caused by an injury to the pelvic area or by a weakening of pelvic muscles. In many cases, the exact cause is not known. What increases the risk? The following factors may make you more likely to develop PFD: Having chronic bladder tissue inflammation (interstitial cystitis). Being an older person. Being overweight. History of radiation treatment for cancer in the pelvic region. Previous pelvic surgery, such as removal of the prostate gland (prostatectomy). What are the signs or symptoms? Symptoms of this condition vary and may include: Bladder symptoms, such as: Trouble starting urination and emptying the bladder. Frequent urinary tract infections. Leaking urine when coughing, laughing, or exercising (stress incontinence). Having to pass urine urgently or frequently. Pain when passing urine. Bowel symptoms, such as: Constipation. Urgent or frequent bowel movements. Incomplete bowel movements. Painful bowel movements. Leaking stool or gas. Unexplained genital or rectal pain. Genital or rectal muscle spasms. Low back pain. Sexual dysfunction, such as erectile dysfunction, premature ejaculation, or pain during or after sexual activity. How is this diagnosed? This condition is diagnosed based on: Your symptoms and medical history. A physical exam. During the exam, your health care provider may check your  pelvic muscles for tightness, spasm, pain, or weakness. This may include a rectal exam. In some cases, you may have diagnostic tests, such as: Electrical muscle function tests. Urine flow testing. X-ray tests of bowel function. Ultrasound of the pelvic organs. How is this treated? Treatment for this condition depends on your symptoms. Treatment options include: Physical therapy. This may include Kegel exercises to help relax or strengthen the pelvic floor muscles. Biofeedback. This type of therapy provides feedback on how tight your pelvic floor muscles are so that you can learn to control them. Massage therapy. A treatment that involves electrical stimulation of the pelvic floor muscles to help control pain (transcutaneous electrical nerve stimulation, or TENS). Sound wave therapy (ultrasound) to reduce muscle spasms. Medicines, such as: Muscle relaxants. Bladder control medicines. Surgery to reconstruct or support pelvic floor muscles may be an option if other treatments do not help. Follow these instructions at home: Activity Do your usual activities as told by your health care provider. Ask your health care provider if you should modify any activities. Do pelvic floor strengthening or relaxing exercises at home as told by your physical therapist. Lifestyle Maintain a healthy weight. Eat foods that are high in fiber, such as beans, whole grains, and fresh fruits and vegetables. Limit foods that are high in fat and processed sugars, such as fried or sweet foods. Manage stress with relaxation techniques such as yoga or meditation. General instructions If you have problems with leakage: Use absorbable pads or wear padded underwear. Wash your genital and anal area frequently with mild soap. Keep your genital and anal area as clean and dry as possible. Ask your health care provider if you should try a barrier cream to prevent skin irritation. Take warm baths  to relieve pelvic muscle  tension or spasms. Take over-the-counter and prescription medicines only as told by your health care provider. Keep all follow-up visits. How is this prevented? The cause of PFD is not always known, but there are a few things you can do to reduce the risk of developing this condition, including: Staying at a healthy weight. Getting regular exercise. Managing stress. Contact a health care provider if: Your symptoms are not improving with home care. You have signs or symptoms of PFD that get worse. You develop new signs or symptoms. You have signs of a urinary tract infection, such as: Fever. Chills. Increased urinary frequency. A burning feeling when urinating. You have not had a bowel movement in 3 days (constipation). Summary Pelvic floor dysfunction results when the muscles and connective tissues in your pelvic floor do not work well. These muscles and their connections form a sling that supports your colon and bladder. In men, these muscles also support the prostate gland. PFD may be caused by an injury to the pelvic area or by a weakening of pelvic muscles. PFD causes pelvic floor muscles to be too weak, too tight, or a combination of both. Symptoms may vary from person to person. In most cases, PFD can be treated with physical therapies and medicines. Surgery may be an option if other treatments do not help. This information is not intended to replace advice given to you by your health care provider. Make sure you discuss any questions you have with your health care provider. Document Revised: 03/24/2021 Document Reviewed: 03/24/2021 Elsevier Patient Education  2024 [employer].  ;a

## 2023-09-26 NOTE — Progress Notes (Signed)
   09/26/2023 9:43 AM   Ileana Roup Apr 27, 1996 [id number]  Reason for visit: Right groin pain, urinary symptoms  HPI: 27 year old male who I saw previously in 2022 for urgency, frequency, and nocturia.  He does have a history of a meatotomy as a child.  I recommended cystoscopy with his symptoms resolved for further evaluation.  He presents today after numerous ER visits over the last 6 weeks for right-sided groin and scrotal pain.  He has had extensive evaluation thus far that has been benign including 2 normal scrotal ultrasounds, normal CT scan, normal urine testing, normal STD testing.  I personally viewed and interpreted the scrotal ultrasounds and CT scan showing no abnormality.  He does jujitsu and feels like his symptoms are worse when he is very physically active.  He has some occasional dysuria but no significant urinary symptoms.  On exam, phallus with widely patent meatus, no lesions, testicles 20 cc and descended bilaterally without masses, no hernias, no masses.  We reviewed at length his extensive benign workup thus far.  His symptoms are most consistent with pelvic floor dysfunction. We discussed the complexities of pelvic pain and possible range of etiologies including pelvic floor dysfunction, chronic bladder pain syndrome, and interstitial cystitis.  We reviewed the AUA guidelines that recommend an algorithmic approach to treatment for these patients, and that a trial of different medications and strategies is sometimes needed to find the approach that works best for each patient's unique situation.  I reinforced the importance of stress management, relaxation, avoiding triggers, and pain management in the approach to pelvic pain.  -Trial of Celebrex 200 mg twice daily x 10 days -Pelvic floor stretching exercises provided -Follow-up with urology as needed, if recurrent problems recommend pelvic floor physical therapy for pelvic floor dysfunction    Sondra Come,  MD  [facility] [address] [place], [place] [address] [phone]

## 2023-10-07 ENCOUNTER — Emergency Department: Payer: BC Managed Care – PPO

## 2023-10-07 ENCOUNTER — Other Ambulatory Visit: Payer: Self-pay

## 2023-10-07 ENCOUNTER — Emergency Department
Admission: EM | Admit: 2023-10-07 | Discharge: 2023-10-08 | Disposition: A | Discharge status: Home / Self Care | Payer: BC Managed Care – PPO | Attending: Emergency Medicine | Admitting: Emergency Medicine

## 2023-10-07 DIAGNOSIS — M542 Cervicalgia: Secondary | ICD-10-CM | POA: Diagnosis not present

## 2023-10-07 DIAGNOSIS — X501XXA Overexertion from prolonged static or awkward postures, initial encounter: Secondary | ICD-10-CM | POA: Insufficient documentation

## 2023-10-07 DIAGNOSIS — S199XXA Unspecified injury of neck, initial encounter: Secondary | ICD-10-CM | POA: Insufficient documentation

## 2023-10-07 MED ORDER — IOHEXOL 350 MG/ML SOLN
75.0000 mL | Freq: Once | INTRAVENOUS | Status: AC | PRN
  Administered 2023-10-07: 75 mL via INTRAVENOUS

## 2023-10-07 NOTE — ED Provider Notes (Signed)
[facility] Provider Note    Event Date/Time   First MD Initiated Contact with Patient 10/07/23 2258     (approximate)   History   Neck Injury   HPI  Dylan Huang is a 27 y.o. male who presents to the ED for evaluation of Neck Injury   Patient presents to the ED for evaluation of an accidental neck injury that occurred about 10 hours ago.  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  No vision changes, syncope or additional or more discrete trauma.  11 ED and urgent care visits in the past 5 months.   Physical Exam   Triage Vital Signs: ED Triage Vitals  Encounter Vitals Group     BP 10/07/23 2119 (!) 151/76     Systolic BP Percentile --      Diastolic BP Percentile --      Pulse Rate 10/07/23 2119 68     Resp 10/07/23 2119 16     Temp 10/07/23 2119 98.2 F (36.8 C)     Temp src --      SpO2 10/07/23 2119 98 %     Weight 10/07/23 2121 189 lb (85.7 kg)     Height 10/07/23 2121 6' (1.829 m)     Head Circumference --      Peak Flow --      Pain Score 10/07/23 2121 5     Pain Loc --      Pain Education --      Exclude from Growth Chart --     Most recent vital signs: Vitals:   10/07/23 2119  BP: (!) 151/76  Pulse: 68  Resp: 16  Temp: 98.2 F (36.8 C)  SpO2: 98%    General: Awake, no distress.  CV:  Good peripheral perfusion.  Resp:  Normal effort.  Abd:  No distention.  MSK:  No deformity noted.  Neuro:  No focal deficits appreciated. Other:  Widely patent airway, conversational full sentences and looks well without difficulty swallowing, breathing or speaking.  No voice alterations.  Able to freely range and move his neck but has some left-sided neck discomfort when twisting to the right. No swelling or asymmetry to the  neck   ED Results / Procedures / Treatments   Labs (all labs ordered are listed, but only abnormal results are displayed) Labs Reviewed - No data to display  EKG   RADIOLOGY Plain film of the neck interpreted by me without evidence of acute pathology. CT angiogram interpreted by me without evidence of vascular pathology or other acute injury  Official radiology report(s): CT Angio Neck W and/or Wo Contrast  Result Date: 10/08/2023 CLINICAL DATA:  Initial evaluation for neck pain status post injury. EXAM: CT ANGIOGRAPHY NECK TECHNIQUE: Multidetector CT imaging of the neck was performed using the standard protocol during bolus administration of intravenous contrast. Multiplanar CT image reconstructions and MIPs were obtained to evaluate the vascular anatomy. Carotid stenosis measurements (when applicable) are obtained utilizing NASCET criteria, using the distal internal carotid diameter as the denominator. RADIATION DOSE REDUCTION: This exam was performed according to the departmental dose-optimization program which includes automated exposure control, adjustment of the mA and/or kV according to patient size and/or use of iterative reconstruction technique. CONTRAST:  75mL OMNIPAQUE IOHEXOL 350 MG/ML SOLN COMPARISON:  Radiograph from earlier the same day. FINDINGS: Aortic arch: Standard branching. Imaged portion shows no evidence of aneurysm or dissection. No significant stenosis of the major arch vessel origins. Right carotid system: No evidence of dissection, stenosis (50% or greater) or occlusion. Left carotid system: No evidence of dissection, stenosis (50% or greater) or occlusion. Vertebral arteries: No evidence of dissection, stenosis (50% or greater) or occlusion. Skeleton: No discrete or worrisome osseous lesions. Other neck: No other acute finding. Upper chest: No other acute finding. IMPRESSION: Negative CTA of the neck. No evidence for acute traumatic vascular injury. Electronically  Signed   By: Rise Mu M.D.   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 Pain to left-sided neck worse with movement. Pain worse with swallowing and eating. EXAM: NECK SOFT TISSUES - 1+ VIEW COMPARISON:  None Available. FINDINGS: There is no evidence of retropharyngeal soft tissue swelling or epiglottic enlargement. The cervical airway is unremarkable and no radio-opaque foreign body identified. Intervertebral disc space height is maintained. No evidence of cervical spine fracture. C7 is obscured by overlapping soft tissue. IMPRESSION: Negative. Electronically Signed   By: Minerva Fester M.D.   On: 10/07/2023 22:17    PROCEDURES and INTERVENTIONS:  Procedures  Medications  iohexol (OMNIPAQUE) 350 MG/ML injection 75 mL (75 mLs Intravenous Contrast Given 10/07/23 2358)     IMPRESSION / MDM / ASSESSMENT AND PLAN / ED COURSE  I reviewed the triage vital signs and the nursing notes.  Differential diagnosis includes, but is not limited to, carotid dissection, tracheal ring fracture or other cartilaginous injury, MSK strain or sprain  {Patient presents with symptoms of an acute illness or injury that is potentially life-threatening.  Patient presents for evaluation of left-sided neck pain after a twisting injury at jujitsu, possibly muscular in etiology with a benign workup and suitable for outpatient management.  No concerning features on exam such as hoarseness, neurologic changes, airway changes.  CTA is reassuring as well.  I believe he is suitable for outpatient management.  Discussed return precautions.  Clinical Course as of 10/08/23 0725  Wynelle Link Oct 08, 2023  0032 Reassessed and discussed reassuring CT scan.  Offered Toradol but he declined indicating that he has had a heart attack in the past when he got Toradol.  We discussed expectant management return precautions. [DS]     Clinical Course User Index [DS] Delton Prairie, MD     FINAL CLINICAL IMPRESSION(S) / ED DIAGNOSES   Final diagnoses:  Injury of neck, initial encounter     Rx / DC Orders   ED Discharge Orders     None        Note:  This document was prepared using Dragon voice recognition software and may include unintentional dictation errors.   Delton Prairie, MD 10/08/23 [phone]

## 2023-10-07 NOTE — ED Triage Notes (Signed)
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 VSS, pt able to speak in complete sentences.

## 2023-10-08 DIAGNOSIS — M542 Cervicalgia: Secondary | ICD-10-CM | POA: Diagnosis not present

## 2023-10-08 MED ORDER — KETOROLAC TROMETHAMINE 30 MG/ML IJ SOLN
30.0000 mg | Freq: Once | INTRAMUSCULAR | Status: DC
  Filled 2023-10-08: qty 1

## 2023-10-08 NOTE — Discharge Instructions (Signed)
Please take Tylenol and ibuprofen/Advil for your pain.  It is safe to take them together, or to alternate them every few hours.  Take up to 1000mg  of Tylenol at a time, up to 4 times per day.  Do not take more than 4000 mg of Tylenol in 24 hours.  For ibuprofen, take 400-600 mg, 3 - 4 times per day.  If your breathing, swallowing, voice seems to get worse.  If you have changes to your vision, passing out or strokelike symptoms, please return to the ED.  Essentially normal CT of your neck

## 2023-11-01 ENCOUNTER — Encounter: Payer: Self-pay | Admitting: Nurse Practitioner

## 2023-11-01 ENCOUNTER — Ambulatory Visit: Payer: BC Managed Care – PPO | Admitting: Nurse Practitioner

## 2023-11-01 VITALS — BP 118/72 | HR 86 | Temp 98.1°F | Resp 14 | Ht 72.0 in | Wt 192.8 lb

## 2023-11-01 DIAGNOSIS — Z7689 Persons encountering health services in other specified circumstances: Secondary | ICD-10-CM

## 2023-11-01 DIAGNOSIS — M25512 Pain in left shoulder: Secondary | ICD-10-CM | POA: Diagnosis not present

## 2023-11-01 DIAGNOSIS — G43009 Migraine without aura, not intractable, without status migrainosus: Secondary | ICD-10-CM | POA: Diagnosis not present

## 2023-11-01 DIAGNOSIS — F330 Major depressive disorder, recurrent, mild: Secondary | ICD-10-CM

## 2023-11-01 DIAGNOSIS — R519 Headache, unspecified: Secondary | ICD-10-CM

## 2023-11-01 DIAGNOSIS — G8929 Other chronic pain: Secondary | ICD-10-CM | POA: Diagnosis not present

## 2023-11-01 NOTE — Progress Notes (Signed)
BP 118/72 (BP Location: Right Arm, Patient Position: Sitting, Cuff Size: Normal)   Pulse 86   Temp 98.1 F (36.7 C) (Oral)   Resp 14   Ht 6' (1.829 m) Comment: per patient  Wt 192 lb 12.8 oz (87.5 kg)   SpO2 98%   BMI 26.15 kg/m    Subjective:    Patient ID: Dylan Huang, male    DOB: [birth year], 27 y.o.   MRN: [id number]  HPI: Dylan Huang is a 27 y.o. male  Chief Complaint  Patient presents with   Establish Care   Discussed the use of AI scribe software for clinical note transcription with the patient, who gave verbal consent to proceed.  History of Present Illness   The patient, with no known medical conditions, presents with daily headaches and morning blurred vision. The headaches, which have been present 'since I was born,' are daily and sometimes progress to migraines lasting up to three days. The migraines are not associated with an aura. The headaches are relieved by hydration, food, and over-the-counter analgesics. The patient also reports waking up with blurred vision every morning for the past year and a half. The blurred vision resolves within two minutes.  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 He reports difficulty falling asleep and waking up early for the past five years.  The patient also reports a recent 'heart scare' due to a medication and caffeine interaction, which resulted in a blood pressure of 182/170. This issue has since resolved. He also reports recent pelvic pain and abdominal pain, which has also resolved.  The patient also reports chronic left shoulder pain, which he attributes to 'wear and tear' from weight lifting and martial arts training.  The patient a history of depression but reports significant stress related to financial concerns, relationships, and other responsibilities. He denies suicidal ideation and does not wish to pursue therapy or  medication for stress management.      11/01/2023    3:10 PM  Depression screen PHQ 2/9  Decreased Interest 1  Down, Depressed, Hopeless 1  PHQ - 2 Score 2  Altered sleeping 3  Tired, decreased energy 1  Change in appetite 0  Feeling bad or failure about yourself  0  Trouble concentrating 0  Moving slowly or fidgety/restless 0  Suicidal thoughts 0  PHQ-9 Score 6  Difficult doing work/chores Not difficult at all       11/01/2023    3:15 PM  GAD 7 : Generalized Anxiety Score  Nervous, Anxious, on Edge 0  Control/stop worrying 1  Worry too much - different things 1  Trouble relaxing 0  Restless 0  Easily annoyed or irritable 1  Afraid - awful might happen 0  Total GAD 7 Score 3  Anxiety Difficulty Not difficult at all     Relevant past medical, surgical, family and social history reviewed and updated as indicated. Interim medical history since our last visit reviewed. Allergies and medications reviewed and updated.  Review of Systems  Constitutional: Negative for fever or weight change.  Respiratory: Negative for cough and shortness of breath.   Cardiovascular: Negative for chest pain or palpitations.  Gastrointestinal: Negative for abdominal pain, no bowel changes.  Musculoskeletal: Negative for gait problem or joint swelling. Positive for left shoulder pain Skin: Negative for rash.  Neurological: Negative for dizziness , positive for  headache.  No other specific complaints in a  complete review of systems (except as listed in HPI above).      Objective:    BP 118/72 (BP Location: Right Arm, Patient Position: Sitting, Cuff Size: Normal)   Pulse 86   Temp 98.1 F (36.7 C) (Oral)   Resp 14   Ht 6' (1.829 m) Comment: per patient  Wt 192 lb 12.8 oz (87.5 kg)   SpO2 98%   BMI 26.15 kg/m   Wt Readings from Last 3 Encounters:  11/01/23 192 lb 12.8 oz (87.5 kg)  10/07/23 189 lb (85.7 kg)  09/26/23 189 lb (85.7 kg)    Physical Exam  Constitutional: Patient  appears well-developed and well-nourished. No distress.  HEENT: head atraumatic, normocephalic, pupils equal and reactive to light, neck supple Cardiovascular: Normal rate, regular rhythm and normal heart sounds.  No murmur heard. No BLE edema. Pulmonary/Chest: Effort normal and breath sounds normal. No respiratory distress. Abdominal: Soft.  There is no tenderness. Psychiatric: Patient has a normal mood and affect. behavior is normal. Judgment and thought content normal.      Assessment & Plan:   Problem List Items Addressed This Visit       Cardiovascular and Mediastinum   Migraine without aura and without status migrainosus, not intractable - Primary     Other   Mild episode of recurrent major depressive disorder (HCC)   Other Visit Diagnoses     Encounter to establish care       Chronic nonintractable headache, unspecified headache type       Chronic left shoulder pain           Assessment and Plan    Chronic Headaches and Migraines Daily headaches and migraines approximately three times per month. Responds to over-the-counter analgesics and rest. No aura or other neurological symptoms reported. -Continue current management with over-the-counter analgesics as needed. - report back if your family notices snoring and we will get a sleep study  Morning Blurred Vision Reports waking up with blurred vision lasting approximately two minutes for the past year and a half. No associated headache. No recent eye examination. -Recommend an eye examination to rule out any ocular causes. - report back if your family notices snoring and we will get a sleep study  Chronic Fatigue and Possible Sleep Apnea Reports chronic fatigue and difficulty sleeping. History of waking up with blurred vision and headaches. No known history of snoring. -Inquire about snoring with family members. -Consider sleep study based on snoring history.  Musculoskeletal Pain Reports chronic pain in various  parts of the body, including shoulder pain with arm elevation. History of heavy physical activity and training. -Consider evaluation for possible musculoskeletal disorders if pain persists or worsens.  General Health Maintenance -Schedule physical examination and full blood panel in approximately six months (June 2025).        Follow up plan: Return in about 6 months (around 05/01/2024) for cpe.

## 2023-12-04 ENCOUNTER — Ambulatory Visit
Admission: EM | Admit: 2023-12-04 | Discharge: 2023-12-04 | Disposition: A | Discharge status: Home / Self Care | Payer: BC Managed Care – PPO | Attending: Emergency Medicine | Admitting: Emergency Medicine

## 2023-12-04 ENCOUNTER — Encounter: Payer: Self-pay | Admitting: Emergency Medicine

## 2023-12-04 DIAGNOSIS — Z23 Encounter for immunization: Secondary | ICD-10-CM | POA: Diagnosis not present

## 2023-12-04 DIAGNOSIS — S61212A Laceration without foreign body of right middle finger without damage to nail, initial encounter: Secondary | ICD-10-CM | POA: Diagnosis not present

## 2023-12-04 DIAGNOSIS — S61210A Laceration without foreign body of right index finger without damage to nail, initial encounter: Secondary | ICD-10-CM

## 2023-12-04 MED ORDER — AMOXICILLIN-POT CLAVULANATE 875-125 MG PO TABS: 1.0000 | ORAL_TABLET | Freq: Two times a day (BID) | ORAL | 0 refills | Status: AC

## 2023-12-04 MED ORDER — TETANUS-DIPHTH-ACELL PERTUSSIS 5-2.5-18.5 LF-MCG/0.5 IM SUSY
0.5000 mL | PREFILLED_SYRINGE | Freq: Once | INTRAMUSCULAR | Status: AC
  Administered 2023-12-04: 0.5 mL via INTRAMUSCULAR

## 2023-12-04 NOTE — ED Provider Notes (Signed)
 [facility]    CSN: [id number] Arrival date & time: 12/04/23  1714      History   Chief Complaint Chief Complaint  Patient presents with   Laceration    HPI Dylan Huang is a 28 y.o. male.   HPI  28 year old male with past medical history significant for mild recurrent major depressive disorder and migraine headaches presents for evaluation of lacerations to his PIP joint of his right index and middle finger.  He reports that he was working with some plastic at work when it slipped and slid across his fingers causing the lacerations.  No active bleeding from the wounds.  Patient is unsure when his last tetanus shot was.  History reviewed. No pertinent past medical history.  Patient Active Problem List   Diagnosis Date Noted   Migraine without aura and without status migrainosus, not intractable 11/01/2023   Mild episode of recurrent major depressive disorder (HCC) 11/01/2023    Past Surgical History:  Procedure Laterality Date   NO PAST SURGERIES         Home Medications    Prior to Admission medications   Medication Sig Start Date End Date Taking? Authorizing Provider  amoxicillin -clavulanate (AUGMENTIN ) 875-125 MG tablet Take 1 tablet by mouth every 12 (twelve) hours for 7 days. 12/04/23 12/11/23 Yes Bernardino Ditch, NP    Family History Family History  Problem Relation Age of Onset   Hypertension Mother    Healthy Mother    Hypertension Father    Healthy Father    Hyperlipidemia Maternal Grandmother    Diabetes Maternal Grandfather    Hyperlipidemia Maternal Grandfather     Social History Social History   Tobacco Use   Smoking status: Former    Types: Cigarettes   Smokeless tobacco: Never  Vaping Use   Vaping status: Never Used  Substance Use Topics   Alcohol use: Not Currently   Drug use: No     Allergies   Doxycycline, Cinnamon, Doxycycline hyclate, and Nickel   Review of Systems Review of Systems  Constitutional:  Negative  for fever.  Skin:  Positive for wound. Negative for color change.  Neurological:  Negative for numbness.     Physical Exam Triage Vital Signs ED Triage Vitals  Encounter Vitals Group     BP      Systolic BP Percentile      Diastolic BP Percentile      Pulse      Resp      Temp      Temp src      SpO2      Weight      Height      Head Circumference      Peak Flow      Pain Score      Pain Loc      Pain Education      Exclude from Growth Chart    No data found.  Updated Vital Signs BP 124/88 (BP Location: Left Arm)   Pulse 62   Temp 97.7 F (36.5 C)   SpO2 99%   Visual Acuity Right Eye Distance:   Left Eye Distance:   Bilateral Distance:    Right Eye Near:   Left Eye Near:    Bilateral Near:     Physical Exam Vitals and nursing note reviewed.  Constitutional:      Appearance: Normal appearance. He is not ill-appearing.  HENT:     Head: Normocephalic and atraumatic.  Musculoskeletal:  General: Tenderness and signs of injury present. No swelling.  Skin:    General: Skin is warm and dry.     Capillary Refill: Capillary refill takes less than 2 seconds.     Findings: No bruising or erythema.  Neurological:     General: No focal deficit present.     Mental Status: He is alert and oriented to person, place, and time.      UC Treatments / Results  Labs (all labs ordered are listed, but only abnormal results are displayed) Labs Reviewed - No data to display  EKG   Radiology No results found.  Procedures Procedures (including critical care time)  Medications Ordered in UC Medications  Tdap (BOOSTRIX) injection 0.5 mL (has no administration in time range)    Initial Impression / Assessment and Plan / UC Course  I have reviewed the triage vital signs and the nursing notes.  Pertinent labs & imaging results that were available during my care of the patient were reviewed by me and considered in my medical decision making (see chart for  details).   Patient is a pleasant, nontoxic-appearing 28 year old male presenting for evaluation of superficial lacerations to the knuckles of the PIP joint of the right second and third finger.  The images are very superficial and do not extend down past the dermis.  They are well-approximated.  I do not feel they need to close do think sutures.  I offered the patient the choice of either Dermabond or applying antibiotic ointment and keeping dressings in place and letting her heal by primary intention.  He is opting to let them heal by primary intention.  I will discharge him home on Augmentin  875 twice daily with food for 7 days to prevent infection.  I will also have staff update patient's tetanus shot.  Final Clinical Impressions(s) / UC Diagnoses   Final diagnoses:  Laceration of right index finger without foreign body without damage to nail, initial encounter  Laceration of right middle finger without foreign body without damage to nail, initial encounter     Discharge Instructions      Your lacerations to your knuckles are very superficial and should heal fine on their own.  Keep them clean and dry and apply new bandages daily.  You should soak your finger in warm water and Epsom salts twice daily followed by applying bacitracin ointment and Band-Aids to cover the wounds.  Do these until they have healed.  I am also going to prescribe Augmentin  875 mg for you to take twice daily for 7 days to prevent infection.  If you develop any redness, swelling, retraction of your finger, pus drainage from the wounds, or fever please return for reevaluation.     ED Prescriptions     Medication Sig Dispense Auth. Provider   amoxicillin -clavulanate (AUGMENTIN ) 875-125 MG tablet Take 1 tablet by mouth every 12 (twelve) hours for 7 days. 14 tablet Bernardino Ditch, NP      PDMP not reviewed this encounter.   Bernardino Ditch, NP 12/04/23 [phone]

## 2023-12-04 NOTE — ED Triage Notes (Signed)
 Pt presents with a laceration to his right pointer and middle finger on plastic today.

## 2023-12-04 NOTE — Discharge Instructions (Addendum)
 Your lacerations to your knuckles are very superficial and should heal fine on their own.  Keep them clean and dry and apply new bandages daily.  You should soak your finger in warm water and Epsom salts twice daily followed by applying bacitracin ointment and Band-Aids to cover the wounds.  Do these until they have healed.  I am also going to prescribe Augmentin  875 mg for you to take twice daily for 7 days to prevent infection.  If you develop any redness, swelling, retraction of your finger, pus drainage from the wounds, or fever please return for reevaluation.

## 2023-12-22 ENCOUNTER — Encounter: Payer: Self-pay | Admitting: Emergency Medicine

## 2023-12-22 ENCOUNTER — Ambulatory Visit
Admission: EM | Admit: 2023-12-22 | Discharge: 2023-12-22 | Disposition: A | Discharge status: Home / Self Care | Payer: BC Managed Care – PPO

## 2023-12-22 DIAGNOSIS — S8012XA Contusion of left lower leg, initial encounter: Secondary | ICD-10-CM | POA: Diagnosis not present

## 2023-12-22 NOTE — Discharge Instructions (Addendum)
As we discussed, you have a contusion (bruise) to your left lower extremity and this will resolve with time.  Wearing compression socks can help facilitate your body's ribs or percent of the blood that has collected.  You may continue to apply ice, or moist heat, to your left lower extremity for 20 minutes at a time, 2-3 times a day, to help with pain and inflammation.  The moist heat may actually help with resorption.  Keep your left lower extremity elevated is much as possible to help decrease pain and swelling.  If you develop any something to your left lower extremity, increased pain, develop red streaks going up your leg, fever, numbness or tingling in your lower extremity or foot please return for reevaluation or see your primary care provider.

## 2023-12-22 NOTE — ED Provider Notes (Signed)
[facility]    CSN: [id number] Arrival date & time: 12/22/23  0807      History   Chief Complaint Chief Complaint  Patient presents with   Leg Pain    left    HPI Dylan Huang is a 28 y.o. male.   HPI  28 year old male with past medical history significant for recurrent MDD and migraine headaches presents for evaluation of pain and swelling to the inner aspect of the left lower leg.  He reports that 4 days ago he was in judo class and performing a throw where he intended to use his foot but moderate making contact with his opponent using his shin.  He went home and noticed that there was significant swelling present.  He elevated his extremity and applied ice and the swelling resolved.  The few days later swelling returned and he came in for evaluation.  He denies any changes with range of motion or numbness or tingling in his foot or distal lower leg.  He also denies any calf pain.  History reviewed. No pertinent past medical history.  Patient Active Problem List   Diagnosis Date Noted   Migraine without aura and without status migrainosus, not intractable 11/01/2023   Mild episode of recurrent major depressive disorder (HCC) 11/01/2023    Past Surgical History:  Procedure Laterality Date   NO PAST SURGERIES         Home Medications    Prior to Admission medications   Not on File    Family History Family History  Problem Relation Age of Onset   Hypertension Mother    Healthy Mother    Hypertension Father    Healthy Father    Hyperlipidemia Maternal Grandmother    Diabetes Maternal Grandfather    Hyperlipidemia Maternal Grandfather     Social History Social History   Tobacco Use   Smoking status: Former    Types: Cigarettes   Smokeless tobacco: Never  Vaping Use   Vaping status: Never Used  Substance Use Topics   Alcohol use: Not Currently   Drug use: No     Allergies   Doxycycline, Cinnamon, Doxycycline hyclate, and  Nickel   Review of Systems Review of Systems  Musculoskeletal:  Positive for myalgias.       Swelling to inner aspect of left lower leg.  Skin:  Positive for color change.  Neurological:  Positive for weakness and numbness.     Physical Exam Triage Vital Signs ED Triage Vitals  Encounter Vitals Group     BP      Systolic BP Percentile      Diastolic BP Percentile      Pulse      Resp      Temp      Temp src      SpO2      Weight      Height      Head Circumference      Peak Flow      Pain Score      Pain Loc      Pain Education      Exclude from Growth Chart    No data found.  Updated Vital Signs BP (!) 150/82 (BP Location: Right Arm)   Pulse 63   Temp 97.9 F (36.6 C) (Oral)   Resp 15   Ht 6' (1.829 m)   Wt 192 lb 10.9 oz (87.4 kg)   SpO2 97%   BMI 26.13 kg/m  Visual Acuity Right Eye Distance:   Left Eye Distance:   Bilateral Distance:    Right Eye Near:   Left Eye Near:    Bilateral Near:     Physical Exam Vitals and nursing note reviewed.  Constitutional:      Appearance: Normal appearance.  Musculoskeletal:        General: Swelling, tenderness and signs of injury present. No deformity. Normal range of motion.  Skin:    General: Skin is warm and dry.     Capillary Refill: Capillary refill takes less than 2 seconds.     Findings: Bruising present.  Neurological:     General: No focal deficit present.     Mental Status: He is alert and oriented to person, place, and time.      UC Treatments / Results  Labs (all labs ordered are listed, but only abnormal results are displayed) Labs Reviewed - No data to display  EKG   Radiology No results found.  Procedures Procedures (including critical care time)  Medications Ordered in UC Medications - No data to display  Initial Impression / Assessment and Plan / UC Course  I have reviewed the triage vital signs and the nursing notes.  Pertinent labs & imaging results that were  available during my care of the patient were reviewed by me and considered in my medical decision making (see chart for details).   Patient is a pleasant, nontoxic-appearing 28 year old male presenting for evaluation of swelling to the proximal medial aspect of the left lower extremity as outlined HPI above.    As you can see the above, the patient has a mild area of edema in the oral proximal left lower extremity just medial of the tibia.  This area is soft though tender to touch.  No warmth appreciated.  Patient has no calf pain and he has a negative Homans' sign.  No swelling appreciated with comparison of contralateral extremity.  Cap refill is less than 2 seconds.  DP and PT pulses are 2+.  Patient exam is consistent with a hematoma and have assured him that it will resolve with time.  He should use compression socks to help facilitate reabsorption along with ice application, over-the-counter analgesia as needed for pain, and elevation is much as possible.  Should he develop swelling to his lower extremity, calf pain, red streaking up his leg, or fever she should return for reevaluation.  Similarly should he develop numbness and tingling in his distal lower leg or foot.   Final Clinical Impressions(s) / UC Diagnoses   Final diagnoses:  Traumatic hematoma of left lower leg, initial encounter     Discharge Instructions      As we discussed, you have a contusion (bruise) to your left lower extremity and this will resolve with time.  Wearing compression socks can help facilitate your body's ribs or percent of the blood that has collected.  You may continue to apply ice, or moist heat, to your left lower extremity for 20 minutes at a time, 2-3 times a day, to help with pain and inflammation.  The moist heat may actually help with resorption.  Keep your left lower extremity elevated is much as possible to help decrease pain and swelling.  If you develop any something to your left lower  extremity, increased pain, develop red streaks going up your leg, fever, numbness or tingling in your lower extremity or foot please return for reevaluation or see your primary care provider.  ED Prescriptions   None    PDMP not reviewed this encounter.   Becky Augusta, NP 12/22/23 [phone]

## 2023-12-22 NOTE — ED Triage Notes (Signed)
Patient was doing "combat sport" 4 days ago.  Patient reports bruising and swelling on his left lower leg below his left knee.

## 2023-12-23 DIAGNOSIS — X509XXD Other and unspecified overexertion or strenuous movements or postures, subsequent encounter: Secondary | ICD-10-CM | POA: Diagnosis not present

## 2023-12-23 DIAGNOSIS — M79662 Pain in left lower leg: Secondary | ICD-10-CM | POA: Diagnosis not present

## 2023-12-23 DIAGNOSIS — S8012XD Contusion of left lower leg, subsequent encounter: Secondary | ICD-10-CM | POA: Diagnosis not present

## 2023-12-23 DIAGNOSIS — R202 Paresthesia of skin: Secondary | ICD-10-CM | POA: Diagnosis not present

## 2023-12-25 ENCOUNTER — Ambulatory Visit: Payer: BC Managed Care – PPO | Admitting: Family Medicine

## 2023-12-25 ENCOUNTER — Ambulatory Visit: Payer: Self-pay

## 2023-12-25 ENCOUNTER — Encounter: Payer: Self-pay | Admitting: Family Medicine

## 2023-12-25 ENCOUNTER — Ambulatory Visit
Admission: RE | Admit: 2023-12-25 | Discharge: 2023-12-25 | Disposition: A | Discharge status: Home / Self Care | Payer: BC Managed Care – PPO | Source: Ambulatory Visit | Attending: Family Medicine | Admitting: Family Medicine

## 2023-12-25 VITALS — BP 120/78 | HR 76 | Resp 16 | Ht 72.0 in | Wt 192.0 lb

## 2023-12-25 DIAGNOSIS — R600 Localized edema: Secondary | ICD-10-CM | POA: Diagnosis not present

## 2023-12-25 DIAGNOSIS — S8012XA Contusion of left lower leg, initial encounter: Secondary | ICD-10-CM | POA: Insufficient documentation

## 2023-12-25 DIAGNOSIS — M79605 Pain in left leg: Secondary | ICD-10-CM | POA: Diagnosis not present

## 2023-12-25 NOTE — Progress Notes (Signed)
Patient ID: Dylan Huang, male    DOB: [birth year], 28 y.o.   MRN: [id number]  PCP: Berniece Salines, FNP  Chief Complaint  Patient presents with   Bleeding/Bruising    X1 week, 1 large bruise/knot on L lower leg. Happened during Judo. Redness around ankle   Headache    on/off x1 week, last about 20-30 min and returns around 5 hrs later.    Subjective:   Dylan Huang is a 28 y.o. male, presents to clinic with CC of the following:  HPI  Injury to left lower leg Bruising, swelling, redness and some tingling has continued to change and he is concerned about being checked for blood clot He states more spots have shown up, swelling is worse, he had burning in his feet and also pain shoot up the back of his left leg No CP or SOB  Patient Active Problem List   Diagnosis Date Noted   Migraine without aura and without status migrainosus, not intractable 11/01/2023   Mild episode of recurrent major depressive disorder (HCC) 11/01/2023     No current outpatient medications on file.   Allergies  Allergen Reactions   Doxycycline Shortness Of Breath   Cinnamon Other (See Comments)    Other reaction(s): Headache   Doxycycline Hyclate     Unsure. Felt like "choking."   Nickel Rash     Social History   Tobacco Use   Smoking status: Former    Types: Cigarettes   Smokeless tobacco: Never  Vaping Use   Vaping status: Never Used  Substance Use Topics   Alcohol use: Not Currently   Drug use: No      Chart Review Today: I personally reviewed active problem list, medication list, allergies, family history, social history, health maintenance, notes from last encounter, lab results, imaging with the patient/caregiver today.   Review of Systems  Constitutional: Negative.   HENT: Negative.    Eyes: Negative.   Respiratory: Negative.    Cardiovascular: Negative.   Gastrointestinal: Negative.   Endocrine: Negative.   Genitourinary: Negative.   Musculoskeletal: Negative.    Skin: Negative.   Allergic/Immunologic: Negative.   Neurological: Negative.   Hematological: Negative.   Psychiatric/Behavioral: Negative.    All other systems reviewed and are negative.      Objective:   Vitals:   12/25/23 1253  BP: 120/78  Pulse: 76  Resp: 16  SpO2: 98%  Weight: 192 lb (87.1 kg)  Height: 6' (1.829 m)    Body mass index is 26.04 kg/m.  Physical Exam Vitals and nursing note reviewed.  Constitutional:      Appearance: He is well-developed.  HENT:     Head: Normocephalic and atraumatic.     Nose: Nose normal.  Eyes:     General:        Right eye: No discharge.        Left eye: No discharge.     Conjunctiva/sclera: Conjunctivae normal.  Neck:     Trachea: No tracheal deviation.  Cardiovascular:     Rate and Rhythm: Normal rate.  Pulmonary:     Effort: Pulmonary effort is normal. No respiratory distress.  Musculoskeletal:        General: Normal range of motion.     Left lower leg: Swelling and tenderness present. Edema present.     Comments: Multiple areas of bruising and swelling to left lower leg No calf tenderness No popliteal fossa tenderness or palpable cord  Skin:  General: Skin is warm and dry.     Findings: No rash.  Neurological:     Mental Status: He is alert.     Motor: No abnormal muscle tone.     Coordination: Coordination normal.  Psychiatric:        Behavior: Behavior normal.      Results for orders placed or performed during the hospital encounter of 09/19/23  Chlamydia/NGC rt PCR ([facility] only)   Collection Time: 09/19/23  8:00 AM   Specimen: Urine  Result Value Ref Range   Specimen source GC/Chlam URINE, RANDOM    Chlamydia Tr NOT DETECTED NOT DETECTED   N gonorrhoeae NOT DETECTED NOT DETECTED  Urinalysis, Routine w reflex microscopic -Urine, Clean Catch   Collection Time: 09/19/23  8:00 AM  Result Value Ref Range   Color, Urine STRAW (A) YELLOW   APPearance CLEAR (A) CLEAR   Specific Gravity, Urine 1.018 1.005 -  1.030   pH 5.0 5.0 - 8.0   Glucose, UA NEGATIVE NEGATIVE mg/dL   Hgb urine dipstick NEGATIVE NEGATIVE   Bilirubin Urine NEGATIVE NEGATIVE   Ketones, ur NEGATIVE NEGATIVE mg/dL   Protein, ur NEGATIVE NEGATIVE mg/dL   Nitrite NEGATIVE NEGATIVE   Leukocytes,Ua NEGATIVE NEGATIVE       Assessment & Plan:     ICD-10-CM   1. Edema of left lower extremity  R60.0 US Venous Img Lower Unilateral Left (DVT)   Pt has sought 3 evaluations and continues to be concerned for DVT due to pain shooting up back of left leg    2. Contusion of left lower extremity, initial encounter  S80.12XA US Venous Img Lower Unilateral Left (DVT)   reassured pt of contusion/hematoma and recent injury he is likely to have changes to the bruising over time     Pt also mentions his chronic HA's which have not changed at all, his PCP who he recently established with had a f/up plan for this and he does need to respond to her.  Resent mychart signup link to his cell (which needed to be updated)      Danelle Berry, PA-C 12/25/23 1:05 PM

## 2023-12-25 NOTE — Progress Notes (Signed)
No DVT and no superficial phlebitis (smaller vein inflammation) on the study  Agree with ER recommendations to wear a compression sock and I would expect gradual improvement in 2-3 weeks

## 2023-12-25 NOTE — Telephone Encounter (Signed)
Appt w/Leisa today at 1pm

## 2023-12-25 NOTE — Telephone Encounter (Signed)
Reason for Disposition  [1] After 10 days AND [2] bruise not fading  Answer Assessment - Initial Assessment Questions 1. APPEARANCE of BRUISE: "Describe the bruise."      Left lower 3. NUMBER: "How many bruises are there?"      1 large one 6. CAUSE: "Tell me how it happened."     judo  Protocols used: Bruises-A-AH  Chief Complaint: Leg bruising Symptoms: bruising from Judo incident Frequency: before 1/24 Pertinent Negatives: Patient denies  Disposition: [] ED /[] Urgent Care (no appt availability in office) / [x] Appointment(In office/virtual)/ []  [place] [facility]/ [] Home Care/ [] Refused Recommended Disposition /[]  Mobile Bus/ []  Follow-up with PCP Additional Notes: Pt hurt his leg a Judo and is worried that there is a blood clot. Pt was seen at [facility]. His concerns continue. Pt would like to be seen. Appt made for today.

## 2023-12-26 ENCOUNTER — Ambulatory Visit: Payer: Self-pay | Admitting: *Deleted

## 2023-12-26 NOTE — Telephone Encounter (Signed)
  Chief Complaint: Thinks he may have a DVT in left lower leg in lower calf.   "A new knot just came up this morning".  He had an U/S done yesterday 1/27 that was negative for DVT in left lower extremity per pt.   The original bruise occurred during a Judo fight on 12/21/2023.   He has been seen at urgent care, [facility] ED and again by Danelle Berry, PA-C due to his concern for a DVT.    Has same concern with this new knot that came up this morning. Symptoms: A burning sensation in left lower calf where a new knot has developed this morning. Frequency: This morning Pertinent Negatives: Patient denies any new injuries or accidents.    Disposition: [] ED /[] Urgent Care (no appt availability in office) / [x] Appointment(In office/virtual)/ []  [place] [facility]/ [] Home Care/ [] Refused Recommended Disposition /[]  Mobile Bus/ []  Follow-up with PCP Additional Notes: Appt made with Della Goo, FNP for 12/27/2023 at 9:00.     (He had a new pt appt scheduled for 03/07/2024 with Della Goo, FNP.    I asked him about this because he was seen by Danelle Berry, PA-C yesterday 12/25/2023 for concern of a DVT in his left lower leg.    "Oh I guess I fricked out when that appt was made".    "You can cancel it".   "I've seen Della Goo, FNP in Dec. 2024".   I confirmed that he had been established as a pt with [facility] before making this last appt with Raynelle Fanning for 12/27/2023).     He did see Della Goo in Dec. 2024.    I cancelled the new pt appt for 03/07/2024.

## 2023-12-26 NOTE — Progress Notes (Unsigned)
   There were no vitals taken for this visit.   Subjective:    Patient ID: Dylan Huang, male    DOB: 10-05-1996, 28 y.o.   MRN: [id number]  HPI: Dylan Huang is a 28 y.o. male  No chief complaint on file.   Discussed the use of AI scribe software for clinical note transcription with the patient, who gave verbal consent to proceed.  History of Present Illness           11/01/2023    3:10 PM  Depression screen PHQ 2/9  Decreased Interest 1  Down, Depressed, Hopeless 1  PHQ - 2 Score 2  Altered sleeping 3  Tired, decreased energy 1  Change in appetite 0  Feeling bad or failure about yourself  0  Trouble concentrating 0  Moving slowly or fidgety/restless 0  Suicidal thoughts 0  PHQ-9 Score 6  Difficult doing work/chores Not difficult at all    Relevant past medical, surgical, family and social history reviewed and updated as indicated. Interim medical history since our last visit reviewed. Allergies and medications reviewed and updated.  Review of Systems  Per HPI unless specifically indicated above     Objective:    There were no vitals taken for this visit.  {Vitals History (Optional):23777} Wt Readings from Last 3 Encounters:  12/25/23 192 lb (87.1 kg)  12/22/23 192 lb 10.9 oz (87.4 kg)  11/01/23 192 lb 12.8 oz (87.5 kg)    Physical Exam  Results for orders placed or performed during the hospital encounter of 09/19/23  Chlamydia/NGC rt PCR ([facility] only)   Collection Time: 09/19/23  8:00 AM   Specimen: Urine  Result Value Ref Range   Specimen source GC/Chlam URINE, RANDOM    Chlamydia Tr NOT DETECTED NOT DETECTED   N gonorrhoeae NOT DETECTED NOT DETECTED  Urinalysis, Routine w reflex microscopic -Urine, Clean Catch   Collection Time: 09/19/23  8:00 AM  Result Value Ref Range   Color, Urine STRAW (A) YELLOW   APPearance CLEAR (A) CLEAR   Specific Gravity, Urine 1.018 1.005 - 1.030   pH 5.0 5.0 - 8.0   Glucose, UA NEGATIVE NEGATIVE mg/dL   Hgb urine  dipstick NEGATIVE NEGATIVE   Bilirubin Urine NEGATIVE NEGATIVE   Ketones, ur NEGATIVE NEGATIVE mg/dL   Protein, ur NEGATIVE NEGATIVE mg/dL   Nitrite NEGATIVE NEGATIVE   Leukocytes,Ua NEGATIVE NEGATIVE   {Labs (Optional):23779}    Assessment & Plan:   Problem List Items Addressed This Visit   None    Assessment and Plan             Follow up plan: No follow-ups on file.

## 2023-12-26 NOTE — Telephone Encounter (Signed)
Message from [place] sent at 12/26/2023 10:59 AM EST  Summary: Swelling on leg   Pt has another swollen mass forming on his leg, was seen recently for hematoma elsewhere.  Best contact: [phone]          Call History  Contact Date/Time Type Contact Phone/Fax By  12/26/2023 10:58 AM EST Phone (Incoming) [place], Pare (Self) [phone] Judie Petit) Randol Kern   Reason for Disposition  [1] Not caused by an injury AND [2] < 5 unexplained bruises  Answer Assessment - Initial Assessment Questions 1. APPEARANCE of BRUISE: "Describe the bruise."      I have a hematoma on my left leg.   I had an ultrasound done yesterday.   No DVT.    2. SIZE: "How large is the bruise?"      The original under my knee on left leg.   The other knot this morning is on lower calf of left leg.   It's not hurting.   It's like a burning sensation.   This knot came up this morning.    I didn't see it there yesterday.   It's small. I was doing judo and got hit on my leg.     3. NUMBER: "How many bruises are there?"      2 places are bruised. 4. LOCATION: "Where is the bruise located?"      See above 5. ONSET: "How long ago did the bruise occur?"      This morning this new knot came up.   6. CAUSE: "Tell me how it happened."     It just came up by itself. 7. MEDICAL HISTORY: "Do you have any medical problems that can cause easy bruising or bleeding?" (e.g., leukemia, liver disease, recent chemotherapy)     No 8. MEDICINES: "Do you take any medications which thin the blood such as: aspirin, heparin, ibuprofen (NSAIDS), Plavix, or Coumadin?"     Just Tylenol for the burning pain 9. OTHER SYMPTOMS: "Do you have any other symptoms?"  (e.g., weakness, dizziness, pain, fever, nosebleed, blood in urine/stool)     No 10. PREGNANCY: "Is there any chance you are pregnant?" "When was your last menstrual period?"       N/A  Protocols used: Bruises-A-AH

## 2023-12-27 ENCOUNTER — Encounter: Payer: Self-pay | Admitting: Nurse Practitioner

## 2023-12-27 ENCOUNTER — Ambulatory Visit: Payer: BC Managed Care – PPO | Admitting: Nurse Practitioner

## 2023-12-27 VITALS — BP 118/78 | HR 71 | Temp 97.8°F | Resp 18 | Ht 72.0 in | Wt 194.6 lb

## 2023-12-27 DIAGNOSIS — S8012XA Contusion of left lower leg, initial encounter: Secondary | ICD-10-CM

## 2023-12-27 DIAGNOSIS — S8012XD Contusion of left lower leg, subsequent encounter: Secondary | ICD-10-CM

## 2024-01-29 ENCOUNTER — Telehealth: Admitting: Nurse Practitioner

## 2024-01-29 DIAGNOSIS — B86 Scabies: Secondary | ICD-10-CM | POA: Diagnosis not present

## 2024-01-29 DIAGNOSIS — R21 Rash and other nonspecific skin eruption: Secondary | ICD-10-CM | POA: Diagnosis not present

## 2024-01-29 MED ORDER — CETIRIZINE HCL 10 MG PO TABS
10.0000 mg | ORAL_TABLET | Freq: Every day | ORAL | 0 refills | Status: AC
Start: 2024-01-29 — End: 2024-02-12

## 2024-01-29 MED ORDER — PERMETHRIN 5 % EX CREA: 1.0000 | TOPICAL_CREAM | Freq: Once | CUTANEOUS | 0 refills | Status: AC

## 2024-01-29 MED ORDER — HYDROCORTISONE 0.5 % EX CREA
1.0000 | TOPICAL_CREAM | Freq: Two times a day (BID) | CUTANEOUS | 0 refills | Status: AC
Start: 2024-01-29 — End: ?

## 2024-01-29 NOTE — Progress Notes (Signed)
 Virtual Visit Consent   Dylan Huang, you are scheduled for a virtual visit with a [place] provider today. Just as with appointments in the office, your consent must be obtained to participate. Your consent will be active for this visit and any virtual visit you may have with one of our providers in the next 365 days. If you have a MyChart account, a copy of this consent can be sent to you electronically.  As this is a virtual visit, video technology does not allow for your provider to perform a traditional examination. This may limit your provider's ability to fully assess your condition. If your provider identifies any concerns that need to be evaluated in person or the need to arrange testing (such as labs, EKG, etc.), we will make arrangements to do so. Although advances in technology are sophisticated, we cannot ensure that it will always work on either your end or our end. If the connection with a video visit is poor, the visit may have to be switched to a telephone visit. With either a video or telephone visit, we are not always able to ensure that we have a secure connection.  By engaging in this virtual visit, you consent to the provision of healthcare and authorize for your insurance to be billed (if applicable) for the services provided during this visit. Depending on your insurance coverage, you may receive a charge related to this service.  I need to obtain your verbal consent now. Are you willing to proceed with your visit today? Dylan Huang has provided verbal consent on 01/29/2024 for a virtual visit (video or telephone). Viviano Simas, FNP  Date: 01/29/2024 5:24 PM   Virtual Visit via Video Note   I, Viviano Simas, connected with  Dylan Huang  ([id number], [birth year]) on 01/29/24 at  5:30 PM EST by a video-enabled telemedicine application and verified that I am speaking with the correct person using two identifiers.  Location: Patient: Virtual Visit Location Patient:  Home Provider: Virtual Visit Location Provider: Home Office   I discussed the limitations of evaluation and management by telemedicine and the availability of in person appointments. The patient expressed understanding and agreed to proceed.    History of Present Illness: Dylan Huang is a 28 y.o. who identifies as a male who was assigned male at birth, and is being seen today with complaints of itchy spots that have been showing up on his right lower leg calf and knee involvement   The day prior to onset he denies staying anywhere different  Started with one spot and has since spread  The original spot remains with additional spots that have since developed   He does Judo he wears pants during this exercise  Denies any contact with gym mat to the areas that are irritated   He has used an over the counter anti-itch cream that did help   He does not take any daily allergy medications   He has not had surgery in the past   Denies any recent travel sleeping in his own bed  Does have two cats - they are indoor cats   Spots have traveled up his leg (originating on calf and extending to thigh/above knee now)  He wears his Judo outfit after every practice     Problems:  Patient Active Problem List   Diagnosis Date Noted   Migraine without aura and without status migrainosus, not intractable 11/01/2023   Mild episode of recurrent major depressive disorder (HCC)  11/01/2023    Allergies:  Allergies  Allergen Reactions   Doxycycline Shortness Of Breath   Cinnamon Other (See Comments)    Other reaction(s): Headache   Doxycycline Hyclate     Unsure. Felt like "choking."   Nickel Rash   Medications: No current outpatient medications on file.  Observations/Objective: Patient is well-developed, well-nourished in no acute distress.  Resting comfortably  at home.  Head is normocephalic, atraumatic.  No labored breathing.  Speech is clear and coherent with logical content.   Patient is alert and oriented at baseline.  Papular erythematous rash that is linear pattern on right lef originating near ankle and traveling to thigh   Assessment and Plan:  1. Rash and nonspecific skin eruption (Primary)  - cetirizine (ZYRTEC ALLERGY) 10 MG tablet; Take 1 tablet (10 mg total) by mouth at bedtime for 14 days.  Dispense: 14 tablet; Refill: 0 - hydrocortisone cream 0.5 %; Apply 1 Application topically 2 (two) times daily.  Dispense: 30 g; Refill: 0  2. Scabies  - permethrin (ELIMITE) 5 % cream; Apply 1 Application topically once for 1 dose. Cover from chin to toes (no genital area no face) and keep on for 10 hours overnight then rinse off.  Dispense: 60 g; Refill: 0     Follow Up Instructions: I discussed the assessment and treatment plan with the patient. The patient was provided an opportunity to ask questions and all were answered. The patient agreed with the plan and demonstrated an understanding of the instructions.  A copy of instructions were sent to the patient via MyChart unless otherwise noted below.    The patient was advised to call back or seek an in-person evaluation if the symptoms worsen or if the condition fails to improve as anticipated.    Viviano Simas, FNP

## 2024-02-07 ENCOUNTER — Encounter: Payer: Self-pay | Admitting: Nurse Practitioner

## 2024-02-07 ENCOUNTER — Ambulatory Visit: Admitting: Nurse Practitioner

## 2024-02-07 VITALS — BP 120/78 | HR 91 | Resp 18 | Ht 72.0 in | Wt 193.3 lb

## 2024-02-07 DIAGNOSIS — H538 Other visual disturbances: Secondary | ICD-10-CM

## 2024-02-07 DIAGNOSIS — R519 Headache, unspecified: Secondary | ICD-10-CM

## 2024-02-07 DIAGNOSIS — R21 Rash and other nonspecific skin eruption: Secondary | ICD-10-CM

## 2024-02-07 MED ORDER — HYDROXYZINE PAMOATE 25 MG PO CAPS
25.0000 mg | ORAL_CAPSULE | Freq: Every evening | ORAL | 0 refills | Status: AC | PRN
Start: 2024-02-07 — End: ?

## 2024-02-07 MED ORDER — PREDNISONE 10 MG (21) PO TBPK
ORAL_TABLET | ORAL | 0 refills | Status: DC
Start: 2024-02-07 — End: 2024-02-16

## 2024-02-07 NOTE — Patient Instructions (Signed)
 Continue allegra daily Start pepcid 2 times a day Start steroid taper

## 2024-02-07 NOTE — Progress Notes (Signed)
 BP 120/78   Pulse 91   Resp 18   Ht 6' (1.829 m)   Wt 193 lb 4.8 oz (87.7 kg)   SpO2 96%   BMI 26.22 kg/m    Subjective:    Patient ID: Dylan Huang, male    DOB: 1996-11-11, 28 y.o.   MRN: [id number]  HPI: Dylan Huang is a 28 y.o. male  Chief Complaint  Patient presents with   Rash    Red bumps w/ itching did telephone visit and was already treated for scabies, treatment di not help   Headache    X4 days   Blurred Vision    Right eye onset years told him he needed to see eye dr. If its been years    Discussed the use of AI scribe software for clinical note transcription with the patient, who gave verbal consent to proceed.  History of Present Illness   The patient presents with a persistent rash and itching.  The symptoms began around the time he noticed 'bites' and redness, including a red spot on his neck. He experiences redness and itching across his body, with sensations of 'something crawling' on his skin, although no visible cause is found. He was initially treated with  allegra daily. On January 29, 2024, he was given permethrin cream to treat for scabies, but reports no improvement after using the entire cream as directed. Despite using permethrin cream for 14 hours, there was no change. Allegra has provided some relief, but symptoms recur daily. He continues to take Allegra.  He reports waking up with epistaxis and experiencing random, brief headaches described as 'like somebody just hit me in the head.' he reports then it goes away.  he says it does not last long. he does not take any treatment because it does not last long.   He also reports blurred vision upon waking, lasting about five minutes, and itching of the eyes, particularly at home. He experiences difficulty breathing randomly throughout the day, especially without Allegra.  He lives in a room with a cat box and has been cleaning it regularly. No new soaps, drugs, or foods have been introduced, and he has  not noticed any reaction when handling his cat. He mentions a family history of extensive allergies, as his mother has numerous documented allergies.       11/01/2023    3:10 PM  Depression screen PHQ 2/9  Decreased Interest 1  Down, Depressed, Hopeless 1  PHQ - 2 Score 2  Altered sleeping 3  Tired, decreased energy 1  Change in appetite 0  Feeling bad or failure about yourself  0  Trouble concentrating 0  Moving slowly or fidgety/restless 0  Suicidal thoughts 0  PHQ-9 Score 6  Difficult doing work/chores Not difficult at all    Relevant past medical, surgical, family and social history reviewed and updated as indicated. Interim medical history since our last visit reviewed. Allergies and medications reviewed and updated.  Review of Systems  Ten systems reviewed and is negative except as mentioned in HPI      Objective:    BP 120/78   Pulse 91   Resp 18   Ht 6' (1.829 m)   Wt 193 lb 4.8 oz (87.7 kg)   SpO2 96%   BMI 26.22 kg/m    Wt Readings from Last 3 Encounters:  02/07/24 193 lb 4.8 oz (87.7 kg)  12/27/23 194 lb 9.6 oz (88.3 kg)  12/25/23 192 lb (87.1 kg)  Physical Exam Vitals reviewed.  Constitutional:      Appearance: Normal appearance.  HENT:     Head: Normocephalic.  Cardiovascular:     Rate and Rhythm: Normal rate.  Pulmonary:     Effort: Pulmonary effort is normal.  Skin:    Findings: Rash present. Rash is urticarial.  Neurological:     General: No focal deficit present.     Mental Status: He is alert and oriented to person, place, and time. Mental status is at baseline.  Psychiatric:        Mood and Affect: Mood normal.        Behavior: Behavior normal.        Thought Content: Thought content normal.        Judgment: Judgment normal.          Assessment & Plan:   Problem List Items Addressed This Visit   None Visit Diagnoses       Blurred vision    -  Primary     Nonintractable episodic headache, unspecified headache type          Rash       Relevant Medications   predniSONE (STERAPRED UNI-PAK 21 TAB) 10 MG (21) TBPK tablet   hydrOXYzine (VISTARIL) 25 MG capsule   Other Relevant Orders   Ambulatory referral to ENT        Assessment and Plan    Pruritic Rash Persistent pruritic rash with itching, redness, and occasional epistaxis despite permethrin treatment for suspected scabies. No improvement with previous treatments. Differential includes allergic reaction or undiagnosed dermatological condition. Allegra provides some relief, suggesting an allergic component. Discussed adding Pepcid for histamine B blockade. Dermatology referral for biopsy if no improvement. - Continue Allegra daily - Add Pepcid 2 times a day - Initiate a trial of prednisone - Prescribe hydroxyzine for nighttime itching  Headaches Experiencing brief, splitting headaches occurring randomly throughout the day, distinct from usual migraines, since rash onset. No current treatment as he is self-resolving.  Blurred Vision Blurred vision worsening over years, particularly upon waking, lasting about five minutes with difficulty focusing. No immediate intervention planned. could be due to allergies.   Allergy Testing Suspicion of allergic component due to symptoms and family history of extensive allergies. Discussed referral for allergy testing to identify specific allergens and potential for allergy injections. - Refer for allergy testing  Environmental Allergen Control Discussed potential allergens, including cat and litter box in room. Explained significant ammonia exposure unlikely with single litter box. - Advise on environmental allergen control, including managing cat exposure  Follow-up Follow-up based on response to treatment plan and allergy testing results.        Follow up plan: Return if symptoms worsen or fail to improve.

## 2024-02-09 ENCOUNTER — Ambulatory Visit: Admitting: Nurse Practitioner

## 2024-02-13 ENCOUNTER — Emergency Department
Admission: EM | Admit: 2024-02-13 | Discharge: 2024-02-13 | Disposition: A | Discharge status: Home / Self Care | Attending: Emergency Medicine | Admitting: Emergency Medicine

## 2024-02-13 ENCOUNTER — Other Ambulatory Visit: Payer: Self-pay

## 2024-02-13 DIAGNOSIS — T7840XA Allergy, unspecified, initial encounter: Secondary | ICD-10-CM | POA: Diagnosis not present

## 2024-02-13 MED ORDER — EPINEPHRINE 0.3 MG/0.3ML IJ SOAJ: 0.3000 mg | INTRAMUSCULAR | 0 refills | Status: AC | PRN

## 2024-02-13 NOTE — ED Provider Notes (Signed)
 [facility] Provider Note    Event Date/Time   First MD Initiated Contact with Patient 02/13/24 [phone]     (approximate)   History   Allergic Reaction   HPI  Dylan Huang is a 28 y.o. male   Past medical history of no significant past medical history presents to Emergency Department with allergic reaction.  He thinks he is allergic to cinnamon and may be at a party with cinnamon in it.  He felt an abnormal tingling sensation in his throat.  He took Benadryl and this is nearly resolved.  He is mostly asymptomatic now with some mild tingling sensation intermittently in the mouth.     He has no difficulty breathing.  He denies any other acute medical complaints and no other symptoms.     External Medical Documents Reviewed: Office visit from outpatient provider in March 2025 documented past medical history including seasonal allergies      Physical Exam   Triage Vital Signs: ED Triage Vitals  Encounter Vitals Group     BP 02/13/24 0027 (!) 145/87     Systolic BP Percentile --      Diastolic BP Percentile --      Pulse Rate 02/13/24 0027 67     Resp 02/13/24 0027 18     Temp 02/13/24 0027 97.8 F (36.6 C)     Temp Source 02/13/24 0027 Oral     SpO2 02/13/24 0027 100 %     Weight 02/13/24 0026 193 lb (87.5 kg)     Height 02/13/24 0026 6' (1.829 m)     Head Circumference --      Peak Flow --      Pain Score 02/13/24 0026 0     Pain Loc --      Pain Education --      Exclude from Growth Chart --     Most recent vital signs: Vitals:   02/13/24 0044 02/13/24 0310  BP: 135/81 123/77  Pulse: 79 71  Resp:  18  Temp:  97.9 F (36.6 C)  SpO2: 96% 98%    General: Awake, no distress.  CV:  Good peripheral perfusion.  Resp:  Normal effort.  Abd:  No distention.  Other:  Well-appearing maintaining airway no intraoral swelling no wheezing and a soft nontender abdomen.  Hemodynamics appropriate reassuring.   ED Results / Procedures /  Treatments   Labs (all labs ordered are listed, but only abnormal results are displayed) Labs Reviewed - No data to display  PROCEDURES:  Critical Care performed: No  Procedures   MEDICATIONS ORDERED IN ED: Medications - No data to display  IMPRESSION / MDM / ASSESSMENT AND PLAN / ED COURSE  I reviewed the triage vital signs and the nursing notes.                                Patient's presentation is most consistent with acute presentation with potential threat to life or bodily function.  Differential diagnosis includes, but is not limited to, allergic reaction, anaphylaxis, airway obstruction   The patient is on the cardiac monitor to evaluate for evidence of arrhythmia and/or significant heart rate changes.  MDM:    Now well-appearing patient with some tingling sensation of the mouth after eating cinnamon that is now completely resolved after Benadryl given by himself prior to presentation.  We observed him for several hours in the emergency department  without any recurrence and complete resolution of symptoms.  No evidence of anaphylaxis or airway obstruction, discharge and prescribed an EpiPen.       FINAL CLINICAL IMPRESSION(S) / ED DIAGNOSES   Final diagnoses:  Allergic reaction, initial encounter     Rx / DC Orders   ED Discharge Orders          Ordered    EPINEPHrine 0.3 mg/0.3 mL IJ SOAJ injection  As needed        02/13/24 0058             Note:  This document was prepared using Dragon voice recognition software and may include unintentional dictation errors.    Pilar Jarvis, MD 02/13/24 [phone]

## 2024-02-13 NOTE — ED Triage Notes (Signed)
 Pt reports possibly eating cinnamon tonight, pt states he is allergic to this. Pt took 25mg  benadryl PTA with some relief. Pt reports feeling like there is something in his throat and runny nose and swollen tongue. Pt reports some difficulty breathing.

## 2024-02-14 ENCOUNTER — Other Ambulatory Visit: Payer: Self-pay

## 2024-02-14 ENCOUNTER — Emergency Department
Admission: EM | Admit: 2024-02-14 | Discharge: 2024-02-14 | Disposition: A | Discharge status: Home / Self Care | Attending: Emergency Medicine | Admitting: Emergency Medicine

## 2024-02-14 ENCOUNTER — Encounter: Payer: Self-pay | Admitting: Emergency Medicine

## 2024-02-14 ENCOUNTER — Emergency Department

## 2024-02-14 DIAGNOSIS — R0789 Other chest pain: Secondary | ICD-10-CM | POA: Diagnosis not present

## 2024-02-14 DIAGNOSIS — R0602 Shortness of breath: Secondary | ICD-10-CM | POA: Insufficient documentation

## 2024-02-14 DIAGNOSIS — R079 Chest pain, unspecified: Secondary | ICD-10-CM

## 2024-02-14 LAB — CBC
HCT: 47.7 % (ref 39.0–52.0)
Hemoglobin: 16.3 g/dL (ref 13.0–17.0)
MCH: 29.2 pg (ref 26.0–34.0)
MCHC: 34.2 g/dL (ref 30.0–36.0)
MCV: 85.3 fL (ref 80.0–100.0)
Platelets: 333 10*3/uL (ref 150–400)
RBC: 5.59 MIL/uL (ref 4.22–5.81)
RDW: 12.6 % (ref 11.5–15.5)
WBC: 6.9 10*3/uL (ref 4.0–10.5)
nRBC: 0 % (ref 0.0–0.2)

## 2024-02-14 LAB — BASIC METABOLIC PANEL
Anion gap: 10 (ref 5–15)
BUN: 21 mg/dL — ABNORMAL HIGH (ref 6–20)
CO2: 24 mmol/L (ref 22–32)
Calcium: 9.8 mg/dL (ref 8.9–10.3)
Chloride: 105 mmol/L (ref 98–111)
Creatinine, Ser: 1.05 mg/dL (ref 0.61–1.24)
GFR, Estimated: >60 mL/min (ref >60)
Glucose, Bld: 103 mg/dL — ABNORMAL HIGH (ref 70–99)
Potassium: 4.4 mmol/L (ref 3.5–5.1)
Sodium: 139 mmol/L (ref 135–145)

## 2024-02-14 LAB — TROPONIN I (HIGH SENSITIVITY): Troponin I (High Sensitivity): 3 ng/L (ref <18)

## 2024-02-14 NOTE — ED Provider Notes (Signed)
 [facility] Provider Note    Event Date/Time   First MD Initiated Contact with Patient 02/14/24 1530     (approximate)  History   Chief Complaint: Chest Pain and Shortness of Breath  HPI  Dylan Huang is a 28 y.o. male presents to the emergency department with chest pain since this morning.  According to the patient he was seen here yesterday for possible allergic reaction.  He was monitored in the emergency department after taking Benadryl home and ultimately was discharged home.  Patient states he was feeling fine last night however upon awakening this morning around 8:00 he noticed some chest discomfort that center of his chest.  He also states when he was sleeping it felt like he would wake up and have to take a gasp of air.  Patient denies any pleuritic pain.  No nausea or diaphoresis.  Physical Exam   Triage Vital Signs: ED Triage Vitals  Encounter Vitals Group     BP 02/14/24 1201 (!) 143/92     Systolic BP Percentile --      Diastolic BP Percentile --      Pulse Rate 02/14/24 1201 85     Resp 02/14/24 1201 18     Temp 02/14/24 1201 97.7 F (36.5 C)     Temp Source 02/14/24 1201 Oral     SpO2 02/14/24 1201 100 %     Weight 02/14/24 1159 192 lb 14.4 oz (87.5 kg)     Height 02/14/24 1159 6' (1.829 m)     Head Circumference --      Peak Flow --      Pain Score 02/14/24 1159 6     Pain Loc --      Pain Education --      Exclude from Growth Chart --     Most recent vital signs: Vitals:   02/14/24 1201  BP: (!) 143/92  Pulse: 85  Resp: 18  Temp: 97.7 F (36.5 C)  SpO2: 100%    General: Awake, no distress.  CV:  Good peripheral perfusion.  Regular rate and rhythm  Resp:  Normal effort.  Equal breath sounds bilaterally.  Chest wall is nontender. Abd:  No distention.  Soft, nontender.  No rebound or guarding.  ED Results / Procedures / Treatments   EKG  EKG viewed and interpreted by myself shows a normal sinus rhythm 80 bpm the  narrow QRS, normal axis, normal intervals, no concerning ST changes.  RADIOLOGY  I have reviewed and interpreted chest x-ray images.  No consolidation on my evaluation. Radiology has read the x-ray as negative.   MEDICATIONS ORDERED IN ED: Medications - No data to display   IMPRESSION / MDM / ASSESSMENT AND PLAN / ED COURSE  I reviewed the triage vital signs and the nursing notes.  Patient's presentation is most consistent with acute presentation with potential threat to life or bodily function.  Patient presents emergency department for chest pain starting around 8:00 this morning.  States intermittent in nature but still somewhat present.  Denies any nausea or diaphoresis.  Patient's workup in the emergency department is reassuring, negative troponin, reassuring CBC, reassuring chemistry.  Chest x-ray is clear and EKG shows no concerning findings.  Patient states that discomfort is somewhat improved if the sits forward which could raise concerns for pericarditis however on EKG there is no concerning findings.  Troponin negative to rule out myocarditis.  I discussed with the patient a trial of ibuprofen 400  mg 3 times daily for the next 3 days if this is chest wall related discomfort that should help that as well.  Otherwise he is to follow-up with his PCP within the next 1 to 2 days for recheck/reevaluation.  Patient agreeable to plan of care.  FINAL CLINICAL IMPRESSION(S) / ED DIAGNOSES   Chest pain  Note:  This document was prepared using Dragon voice recognition software and may include unintentional dictation errors.   Minna Antis, MD 02/14/24 [phone]

## 2024-02-14 NOTE — ED Triage Notes (Signed)
 PT complains of difficulty  breathing in middle of the night  hard to take a deep breath. Chest started hurting this morning and pain down left arm.

## 2024-02-14 NOTE — Discharge Instructions (Signed)
 Your workup in the emergency department showed normal results.  As we discussed please take 400 mg of ibuprofen (2 regular strength tablets) 3 times a day for the next 3 days to help with discomfort.  Return to the emergency department for any worsening chest pain shortness of breath or any other symptom personally concerning to yourself.  Otherwise please follow-up with your primary care doctor in the next Avril days for recheck/reevaluation.

## 2024-02-16 ENCOUNTER — Encounter: Payer: Self-pay | Admitting: Nurse Practitioner

## 2024-02-16 ENCOUNTER — Ambulatory Visit: Payer: Self-pay

## 2024-02-16 ENCOUNTER — Ambulatory Visit: Attending: Nurse Practitioner

## 2024-02-16 ENCOUNTER — Ambulatory Visit: Admitting: Nurse Practitioner

## 2024-02-16 VITALS — BP 124/72 | HR 85 | Temp 97.9°F | Resp 18 | Ht 72.0 in | Wt 190.3 lb

## 2024-02-16 DIAGNOSIS — R0689 Other abnormalities of breathing: Secondary | ICD-10-CM | POA: Diagnosis not present

## 2024-02-16 DIAGNOSIS — R079 Chest pain, unspecified: Secondary | ICD-10-CM | POA: Diagnosis not present

## 2024-02-16 DIAGNOSIS — R002 Palpitations: Secondary | ICD-10-CM

## 2024-02-16 DIAGNOSIS — Z20822 Contact with and (suspected) exposure to covid-19: Secondary | ICD-10-CM | POA: Diagnosis not present

## 2024-02-16 DIAGNOSIS — Z881 Allergy status to other antibiotic agents status: Secondary | ICD-10-CM | POA: Diagnosis not present

## 2024-02-16 DIAGNOSIS — R0602 Shortness of breath: Secondary | ICD-10-CM | POA: Diagnosis not present

## 2024-02-16 NOTE — Progress Notes (Signed)
 BP 124/72   Pulse 85   Temp 97.9 F (36.6 C)   Resp 18   Ht 6' (1.829 m)   Wt 190 lb 4.8 oz (86.3 kg)   SpO2 97%   BMI 25.81 kg/m    Subjective:    Patient ID: Dylan Huang, male    DOB: 01-02-1996, 28 y.o.   MRN: [id number]  HPI: Dylan Huang is a 28 y.o. male  Chief Complaint  Patient presents with   Referral    Sleep study states not sleeping when he does he jolts awake and has sob    Discussed the use of AI scribe software for clinical note transcription with the patient, who gave verbal consent to proceed.  History of Present Illness Dylan Huang is a 28 year old male who presents with chest pain.  He presents for follow-up after an ER visit on February 14, 2024, due to chest pain. He woke up with central chest discomfort and a sensation of gasping for air. An EKG showed normal sinus rhythm with no ST changes, and a chest x-ray was negative. He was given ibuprofen 400 mg three times a day for three days. He experienced chest pain rated 7 to 8 out of 10, located under the pectoral muscle, with numbness from his left wrist to the upper arm. Despite these symptoms, ER evaluations, including EKG and chest x-ray, were normal. Tylenol and Benadryl taken together might have elevated anxiety levels, contributing to his symptoms. He also reports feeling palpitations when he lays down.   He reports difficulty sleeping, describing a sensation of his body 'voluntarily just stops breathing' as he falls asleep, causing him to jolt awake and catch his breath. His family has not observed any breathing issues during sleep.        02/16/2024    8:25 AM 11/01/2023    3:10 PM  Depression screen PHQ 2/9  Decreased Interest 0 1  Down, Depressed, Hopeless 1 1  PHQ - 2 Score 1 2  Altered sleeping 3 3  Tired, decreased energy 1 1  Change in appetite 0 0  Feeling bad or failure about yourself  0 0  Trouble concentrating 1 0  Moving slowly or fidgety/restless 0 0  Suicidal thoughts 0 0   PHQ-9 Score 6 6  Difficult doing work/chores Not difficult at all Not difficult at all    Relevant past medical, surgical, family and social history reviewed and updated as indicated. Interim medical history since our last visit reviewed. Allergies and medications reviewed and updated.  Review of Systems  Ten systems reviewed and is negative except as mentioned in HPI      Objective:    BP 124/72   Pulse 85   Temp 97.9 F (36.6 C)   Resp 18   Ht 6' (1.829 m)   Wt 190 lb 4.8 oz (86.3 kg)   SpO2 97%   BMI 25.81 kg/m    Wt Readings from Last 3 Encounters:  02/16/24 190 lb 4.8 oz (86.3 kg)  02/14/24 192 lb 14.4 oz (87.5 kg)  02/13/24 193 lb (87.5 kg)    Physical Exam  Physical Exam GENERAL: Alert, cooperative, well developed, no acute distress. HEENT: Normocephalic, normal oropharynx, moist mucous membranes. CHEST: Clear to auscultation bilaterally, no wheezes, rhonchi, or crackles. CARDIOVASCULAR: Normal heart rate and rhythm, S1 and S2 normal without murmurs. ABDOMEN: Soft, non-tender, non-distended, without organomegaly, normal bowel sounds. EXTREMITIES: No cyanosis or edema. NEUROLOGICAL: Cranial nerves grossly intact,  moves all extremities without gross motor or sensory deficit.    Results for orders placed or performed during the hospital encounter of 02/14/24  Basic metabolic panel   Collection Time: 02/14/24 12:02 PM  Result Value Ref Range   Sodium 139 135 - 145 mmol/L   Potassium 4.4 3.5 - 5.1 mmol/L   Chloride 105 98 - 111 mmol/L   CO2 24 22 - 32 mmol/L   Glucose, Bld 103 (H) 70 - 99 mg/dL   BUN 21 (H) 6 - 20 mg/dL   Creatinine, Ser 8.65 0.61 - 1.24 mg/dL   Calcium 9.8 8.9 - 78.4 mg/dL   GFR, Estimated >69 >62 mL/min   Anion gap 10 5 - 15  CBC   Collection Time: 02/14/24 12:02 PM  Result Value Ref Range   WBC 6.9 4.0 - 10.5 K/uL   RBC 5.59 4.22 - 5.81 MIL/uL   Hemoglobin 16.3 13.0 - 17.0 g/dL   HCT 95.2 84.1 - 32.4 %   MCV 85.3 80.0 - 100.0 fL    MCH 29.2 26.0 - 34.0 pg   MCHC 34.2 30.0 - 36.0 g/dL   RDW 40.1 02.7 - 25.3 %   Platelets 333 150 - 400 K/uL   nRBC 0.0 0.0 - 0.2 %  Troponin I (High Sensitivity)   Collection Time: 02/14/24 12:02 PM  Result Value Ref Range   Troponin I (High Sensitivity) 3 <18 ng/L       Assessment & Plan:   Problem List Items Addressed This Visit   None Visit Diagnoses       Chest pain, unspecified type    -  Primary   Relevant Orders   CBC with Differential/Platelet   COMPLETE METABOLIC PANEL WITH GFR   Sedimentation rate   C-reactive protein   ANA   Rheumatoid Factor   Troponin I     Gasping for breath       Relevant Orders   Ambulatory referral to Pulmonology     Palpitations       Relevant Orders   TSH   LONG TERM MONITOR (3-14 DAYS)        Assessment and Plan Assessment & Plan Chest Pain Intermittent chest pain for three days, located centrally and under the pectoral muscle, rated 7-8 in severity, with left arm numbness. Normal EKG and chest X-ray. Differential includes early pericarditis, though unlikely. Anxiety from concurrent Tylenol and Benadryl use may contribute. Reports dyspnea when supine and occasional palpitations. - Order lab work to evaluate for pericarditis - Order autoimmune panel due to family history - Order Ziopatch heart monitor to assess rhythm during palpitations   Sleep Apnea Reports nocturnal dyspnea, apneic episodes, and gasping. No prior sleep study. Referred to pulmonology for home sleep study. - Refer to pulmonology for home sleep study      Follow up plan: Return if symptoms worsen or fail to improve.

## 2024-02-16 NOTE — Telephone Encounter (Signed)
 fyi

## 2024-02-16 NOTE — Telephone Encounter (Signed)
 Copied from CRM [id number]. Topic: Clinical - Medical Advice >> Feb 16, 2024 12:14 PM Gery Pray wrote: Reason for CRM: Patient states that his stomach is pulsing to the rhythm of his heart and he can visually see it. Patient would like to know if this normal and something he should expect. In conclusion, patient states that the longer the day goes on the worse it get. This is persistent. Please contact patient back at [phone].   Chief Complaint: Still having "pounding heart in my stomach and I'm more fatigued. I don't know if I should go to the ED or what." Denies any chest pain.Vague with symptoms.  Symptoms: Above Frequency: Today Pertinent Negatives: Patient denies  Disposition: [] ED /[] Urgent Care (no appt availability in office) / [] Appointment(In office/virtual)/ []  [facility]/ [] Home Care/ [] Refused Recommended Disposition /[] [facility]/ [x]  Follow-up with PCP Additional Notes: Please advise pt.  Reason for Disposition  [1] Palpitations AND [2] no improvement after using Care Advice  Answer Assessment - Initial Assessment Questions 1. DESCRIPTION: "Please describe your heart rate or heartbeat that you are having" (e.g., fast/slow, regular/irregular, skipped or extra beats, "palpitations")     Pounding 2. ONSET: "When did it start?" (Minutes, hours or days)      Today 3. DURATION: "How long does it last" (e.g., seconds, minutes, hours)     All day 4. PATTERN "Does it come and go, or has it been constant since it started?"  "Does it get worse with exertion?"   "Are you feeling it now?"     Constant 5. TAP: "Using your hand, can you tap out what you are feeling on a chair or table in front of you, so that I can hear?" (Note: not all patients can do this)       No 6. HEART RATE: "Can you tell me your heart rate?" "How many beats in 15 seconds?"  (Note: not all patients can do this)       No 7. RECURRENT SYMPTOM: "Have you ever had this before?" If Yes, ask:  "When was the last time?" and "What happened that time?"      No 8. CAUSE: "What do you think is causing the palpitations?"     Unsure 9. CARDIAC HISTORY: "Do you have any history of heart disease?" (e.g., heart attack, angina, bypass surgery, angioplasty, arrhythmia)      No 10. OTHER SYMPTOMS: "Do you have any other symptoms?" (e.g., dizziness, chest pain, sweating, difficulty breathing)       No 11. PREGNANCY: "Is there any chance you are pregnant?" "When was your last menstrual period?"       N/a  Protocols used: Heart Rate and Heartbeat Questions-A-AH

## 2024-02-16 NOTE — Telephone Encounter (Signed)
 FYI

## 2024-02-16 NOTE — Telephone Encounter (Signed)
 Sent through my chart

## 2024-02-17 LAB — CBC WITH DIFFERENTIAL/PLATELET
Absolute Lymphocytes: 2031 {cells}/uL (ref 850–3900)
Absolute Monocytes: 469 {cells}/uL (ref 200–950)
Basophils Absolute: 28 {cells}/uL (ref 0–200)
Basophils Relative: 0.4 %
Eosinophils Absolute: 192 {cells}/uL (ref 15–500)
Eosinophils Relative: 2.7 %
HCT: 46.9 % (ref 38.5–50.0)
Hemoglobin: 15.3 g/dL (ref 13.2–17.1)
MCH: 29 pg (ref 27.0–33.0)
MCHC: 32.6 g/dL (ref 32.0–36.0)
MCV: 89 fL (ref 80.0–100.0)
MPV: 10.6 fL (ref 7.5–12.5)
Monocytes Relative: 6.6 %
Neutro Abs: 4381 {cells}/uL (ref 1500–7800)
Neutrophils Relative %: 61.7 %
Platelets: 296 10*3/uL (ref 140–400)
RBC: 5.27 10*6/uL (ref 4.20–5.80)
RDW: 12.6 % (ref 11.0–15.0)
Total Lymphocyte: 28.6 %
WBC: 7.1 10*3/uL (ref 3.8–10.8)

## 2024-02-17 LAB — COMPLETE METABOLIC PANEL WITH GFR
AG Ratio: 1.8 (calc) (ref 1.0–2.5)
ALT: 14 U/L (ref 9–46)
AST: 18 U/L (ref 10–40)
Albumin: 4.9 g/dL (ref 3.6–5.1)
Alkaline phosphatase (APISO): 77 U/L (ref 36–130)
BUN: 22 mg/dL (ref 7–25)
CO2: 30 mmol/L (ref 20–32)
Calcium: 9.9 mg/dL (ref 8.6–10.3)
Chloride: 105 mmol/L (ref 98–110)
Creat: 1.13 mg/dL (ref 0.60–1.24)
Globulin: 2.8 g/dL (ref 1.9–3.7)
Glucose, Bld: 90 mg/dL (ref 65–99)
Potassium: 4.5 mmol/L (ref 3.5–5.3)
Sodium: 141 mmol/L (ref 135–146)
Total Bilirubin: 0.8 mg/dL (ref 0.2–1.2)
Total Protein: 7.7 g/dL (ref 6.1–8.1)

## 2024-02-17 LAB — SEDIMENTATION RATE: Sed Rate: 6 mm/h (ref 0–15)

## 2024-02-17 LAB — TROPONIN I: Troponin I: 4 ng/L (ref <=47)

## 2024-02-17 LAB — TSH: TSH: 3.93 m[IU]/L (ref 0.40–4.50)

## 2024-02-17 LAB — ANA: Anti Nuclear Antibody (ANA): NEGATIVE

## 2024-02-17 LAB — RHEUMATOID FACTOR: Rheumatoid fact SerPl-aCnc: <10 [IU]/mL (ref <14)

## 2024-02-17 LAB — C-REACTIVE PROTEIN: CRP: <3 mg/L (ref <8.0)

## 2024-02-18 DIAGNOSIS — R0602 Shortness of breath: Secondary | ICD-10-CM | POA: Diagnosis not present

## 2024-02-18 DIAGNOSIS — R1010 Upper abdominal pain, unspecified: Secondary | ICD-10-CM | POA: Diagnosis not present

## 2024-02-18 DIAGNOSIS — R109 Unspecified abdominal pain: Secondary | ICD-10-CM | POA: Diagnosis not present

## 2024-02-18 DIAGNOSIS — R002 Palpitations: Secondary | ICD-10-CM | POA: Diagnosis not present

## 2024-02-19 ENCOUNTER — Telehealth: Admitting: Physician Assistant

## 2024-02-19 ENCOUNTER — Encounter: Payer: Self-pay | Admitting: Nurse Practitioner

## 2024-02-19 DIAGNOSIS — T7840XA Allergy, unspecified, initial encounter: Secondary | ICD-10-CM

## 2024-02-19 DIAGNOSIS — R42 Dizziness and giddiness: Secondary | ICD-10-CM | POA: Diagnosis not present

## 2024-02-19 DIAGNOSIS — Z8659 Personal history of other mental and behavioral disorders: Secondary | ICD-10-CM

## 2024-02-19 MED ORDER — MECLIZINE HCL 12.5 MG PO TABS: 12.5000 mg | ORAL_TABLET | Freq: Three times a day (TID) | ORAL | 0 refills | Status: AC | PRN

## 2024-02-19 NOTE — Patient Instructions (Signed)
  Ileana Roup, thank you for joining Tylene Fantasia Ward, PA-C for today's virtual visit.  While this provider is not your primary care provider (PCP), if your PCP is located in our provider database this encounter information will be shared with them immediately following your visit.   A [facility] MyChart account gives you access to today's visit and all your visits, tests, and labs performed at [facility] " click here if you don't have a [facility] MyChart account or go to [email or web address]  Consent: (Patient) Dylan Huang provided verbal consent for this virtual visit at the beginning of the encounter.  Current Medications:  Current Outpatient Medications:    cetirizine (ZYRTEC ALLERGY) 10 MG tablet, Take 1 tablet (10 mg total) by mouth at bedtime for 14 days., Disp: 14 tablet, Rfl: 0   EPINEPHrine 0.3 mg/0.3 mL IJ SOAJ injection, Inject 0.3 mg into the muscle as needed., Disp: 1 each, Rfl: 0   hydrocortisone cream 0.5 %, Apply 1 Application topically 2 (two) times daily., Disp: 30 g, Rfl: 0   hydrOXYzine (VISTARIL) 25 MG capsule, Take 1 capsule (25 mg total) by mouth at bedtime as needed for itching., Disp: 30 capsule, Rfl: 0   Medications ordered in this encounter:  No orders of the defined types were placed in this encounter.    *If you need refills on other medications prior to your next appointment, please contact your pharmacy*  Follow-Up: Call back or seek an in-person evaluation if the symptoms worsen or if the condition fails to improve as anticipated.  [facility] [phone]  Other Instructions Recommend daily Allegra.  Can also use Flonase daily.  I have sent in meclizine that can be used as needed for dizziness.  If no improvement recommend in person evaluation.    If you have been instructed to have an in-person evaluation today at a local Urgent Care facility, please use the link below. It will take you to a list of all of our  available [facility], including address, phone number and hours of operation. Please do not delay care.  [facility]  If you or a family member do not have a primary care provider, use the link below to schedule a visit and establish care. When you choose a [facility] primary care physician or advanced practice provider, you gain a long-term partner in health. Find a Primary Care Provider  Learn more about [facility]'s in-office and virtual care options: [place] - Get Care Now

## 2024-02-19 NOTE — Progress Notes (Signed)
 Virtual Visit Consent   ADRIK KHIM, you are scheduled for a virtual visit with a [facility] provider today. Just as with appointments in the office, your consent must be obtained to participate. Your consent will be active for this visit and any virtual visit you may have with one of our providers in the next 365 days. If you have a MyChart account, a copy of this consent can be sent to you electronically.  As this is a virtual visit, video technology does not allow for your provider to perform a traditional examination. This may limit your provider's ability to fully assess your condition. If your provider identifies any concerns that need to be evaluated in person or the need to arrange testing (such as labs, EKG, etc.), we will make arrangements to do so. Although advances in technology are sophisticated, we cannot ensure that it will always work on either your end or our end. If the connection with a video visit is poor, the visit may have to be switched to a telephone visit. With either a video or telephone visit, we are not always able to ensure that we have a secure connection.  By engaging in this virtual visit, you consent to the provision of healthcare and authorize for your insurance to be billed (if applicable) for the services provided during this visit. Depending on your insurance coverage, you may receive a charge related to this service.  I need to obtain your verbal consent now. Are you willing to proceed with your visit today? SHAROD PETSCH has provided verbal consent on 02/19/2024 for a virtual visit (video or telephone). Tylene Fantasia Ward, PA-C  Date: 02/19/2024 7:48 PM   Virtual Visit via Video Note   I, Tylene Fantasia Ward, connected with  Dylan Huang  ([id number], 05-05-1996) on 02/19/24 at  7:45 PM EDT by a video-enabled telemedicine application and verified that I am speaking with the correct person using two identifiers.  Location: Patient: Virtual Visit Location Patient:  Home Provider: Virtual Visit Location Provider: Home Office   I discussed the limitations of evaluation and management by telemedicine and the availability of in person appointments. The patient expressed understanding and agreed to proceed.    History of Present Illness: Dylan Huang is a 28 y.o. who identifies as a male who was assigned male at birth, and is being seen today for ear pressure, mild dizziness that started about three days ago.  He has tried Care[employer] adviser which has helped somewhat. Pt reports he feels dizziness when changing positions, sitting up in bed.  Denies visual changes, syncope, palpitations.  HPI: HPI  Problems:  Patient Active Problem List   Diagnosis Date Noted   Migraine without aura and without status migrainosus, not intractable 11/01/2023   Mild episode of recurrent major depressive disorder (HCC) 11/01/2023    Allergies:  Allergies  Allergen Reactions   Doxycycline Shortness Of Breath   Cinnamon Other (See Comments)    Other reaction(s): Headache   Doxycycline Hyclate     Unsure. Felt like "choking."   Nickel Rash   Medications:  Current Outpatient Medications:    cetirizine (ZYRTEC ALLERGY) 10 MG tablet, Take 1 tablet (10 mg total) by mouth at bedtime for 14 days., Disp: 14 tablet, Rfl: 0   EPINEPHrine 0.3 mg/0.3 mL IJ SOAJ injection, Inject 0.3 mg into the muscle as needed., Disp: 1 each, Rfl: 0   hydrocortisone cream 0.5 %, Apply 1 Application topically 2 (two) times daily., Disp: 30 g,  Rfl: 0   hydrOXYzine (VISTARIL) 25 MG capsule, Take 1 capsule (25 mg total) by mouth at bedtime as needed for itching., Disp: 30 capsule, Rfl: 0  Observations/Objective: Patient is well-developed, well-nourished in no acute distress.  Resting comfortably at home.  Head is normocephalic, atraumatic.  No labored breathing.  Speech is clear and coherent with logical content.  Patient is alert and oriented at baseline.    Assessment and Plan: 1. Allergy, initial  encounter (Primary)  2. Vertigo  Dizziness possibly vertigo sx, advised in person evaluation if no improvement.   Follow Up Instructions: I discussed the assessment and treatment plan with the patient. The patient was provided an opportunity to ask questions and all were answered. The patient agreed with the plan and demonstrated an understanding of the instructions.  A copy of instructions were sent to the patient via MyChart unless otherwise noted below.     The patient was advised to call back or seek an in-person evaluation if the symptoms worsen or if the condition fails to improve as anticipated.    Tylene Fantasia Ward, PA-C

## 2024-02-22 DIAGNOSIS — Z0000 Encounter for general adult medical examination without abnormal findings: Secondary | ICD-10-CM | POA: Diagnosis not present

## 2024-02-22 DIAGNOSIS — R0789 Other chest pain: Secondary | ICD-10-CM | POA: Diagnosis not present

## 2024-02-22 DIAGNOSIS — Z8249 Family history of ischemic heart disease and other diseases of the circulatory system: Secondary | ICD-10-CM | POA: Diagnosis not present

## 2024-02-22 DIAGNOSIS — R109 Unspecified abdominal pain: Secondary | ICD-10-CM | POA: Diagnosis not present

## 2024-02-25 DIAGNOSIS — R109 Unspecified abdominal pain: Secondary | ICD-10-CM | POA: Diagnosis not present

## 2024-03-07 ENCOUNTER — Ambulatory Visit: Payer: BC Managed Care – PPO | Admitting: Internal Medicine

## 2024-03-20 ENCOUNTER — Ambulatory Visit: Admitting: Nurse Practitioner

## 2024-04-01 ENCOUNTER — Ambulatory Visit: Admitting: Internal Medicine

## 2024-04-01 NOTE — Progress Notes (Deleted)
 [facility] Consultation      Date: 04/01/2024,   MRN# [id number] Dylan Huang Mar 19, 1996  Dylan Huang is a 28 y.o. old male seen in consultation for *** at the request of ***.     CHIEF COMPLAINT:      HISTORY OF PRESENT ILLNESS      PAST MEDICAL HISTORY   No past medical history on file.   SURGICAL HISTORY   Past Surgical History:  Procedure Laterality Date   NO PAST SURGERIES       FAMILY HISTORY   Family History  Problem Relation Age of Onset   Hypertension Mother    Healthy Mother    Hypertension Father    Healthy Father    Hyperlipidemia Maternal Grandmother    Diabetes Maternal Grandfather    Hyperlipidemia Maternal Grandfather      SOCIAL HISTORY   Social History   Tobacco Use   Smoking status: Former    Types: Cigarettes   Smokeless tobacco: Never  Vaping Use   Vaping status: Never Used  Substance Use Topics   Alcohol use: Not Currently   Drug use: No     MEDICATIONS    Home Medication:  Current Outpatient Rx   Order #: [id number] Class: Normal   Order #: [id number] Class: Normal   Order #: [id number] Class: Normal   Order #: [id number] Class: Normal   Order #: [id number] Class: Normal    Current Medication:  Current Outpatient Medications:    cetirizine  (ZYRTEC  ALLERGY) 10 MG tablet, Take 1 tablet (10 mg total) by mouth at bedtime for 14 days., Disp: 14 tablet, Rfl: 0   EPINEPHrine  0.3 mg/0.3 mL IJ SOAJ injection, Inject 0.3 mg into the muscle as needed., Disp: 1 each, Rfl: 0   hydrocortisone  cream 0.5 %, Apply 1 Application topically 2 (two) times daily., Disp: 30 g, Rfl: 0   hydrOXYzine  (VISTARIL ) 25 MG capsule, Take 1 capsule (25 mg total) by mouth at bedtime as needed for itching., Disp: 30 capsule, Rfl: 0   meclizine  (ANTIVERT ) 12.5 MG tablet, Take 1 tablet (12.5 mg total) by mouth 3 (three) times daily as needed for dizziness., Disp: 30 tablet, Rfl: 0    ALLERGIES   Doxycycline, Cinnamon, Doxycycline  hyclate, and Nickel   There were no vitals taken for this visit.   Review of Systems: Gen:  Denies  fever, sweats, chills weight loss  HEENT: Denies blurred vision, double vision, ear pain, eye pain, hearing loss, nose bleeds, sore throat Cardiac:  No dizziness, chest pain or heaviness, chest tightness,edema, No JVD Resp:   No cough, -sputum production, -shortness of breath,-wheezing, -hemoptysis,  Other:  All other systems negative   Physical Examination:   General Appearance: No distress  EYES PERRLA, EOM intact.   NECK Supple, No JVD Pulmonary: normal breath sounds, No wheezing.  CardiovascularNormal S1,S2.  No m/r/g.   Abdomen: Benign, Soft, non-tender. Neurology UE/LE 5/5 strength, no focal deficits Ext pulses intact, cap refill intact ALL OTHER ROS ARE NEGATIVE      IMAGING    No results found.    ASSESSMENT/PLAN                   MEDICATION ADJUSTMENTS/LABS AND TESTS ORDERED:    CURRENT MEDICATIONS REVIEWED AT LENGTH WITH PATIENT TODAY   Patient  satisfied with Plan of action and management. All questions answered  Follow up   I spent a total of *** minutes reviewing chart data, face-to-face evaluation with the patient, counseling and  coordination of care as detailed above.     Lady Pier, M.D.  [facility]  Medical Director [facility] Medical Director [facility] Cardio-Pulmonary Department

## 2024-04-05 ENCOUNTER — Encounter: Payer: Self-pay | Admitting: Internal Medicine

## 2024-04-27 ENCOUNTER — Telehealth: Admitting: Physician Assistant

## 2024-04-27 DIAGNOSIS — J069 Acute upper respiratory infection, unspecified: Secondary | ICD-10-CM | POA: Diagnosis not present

## 2024-04-28 MED ORDER — FLUTICASONE PROPIONATE 50 MCG/ACT NA SUSP: 2.0000 | Freq: Every day | NASAL | 6 refills | Status: AC

## 2024-04-28 MED ORDER — BENZONATATE 100 MG PO CAPS: 100.0000 mg | ORAL_CAPSULE | Freq: Three times a day (TID) | ORAL | 0 refills | Status: AC | PRN

## 2024-04-28 NOTE — Progress Notes (Signed)

## 2024-05-01 ENCOUNTER — Encounter: Payer: BC Managed Care – PPO | Admitting: Nurse Practitioner

## 2024-05-01 ENCOUNTER — Ambulatory Visit: Admitting: Nurse Practitioner

## 2024-05-07 ENCOUNTER — Ambulatory Visit: Admitting: Nurse Practitioner

## 2024-05-07 ENCOUNTER — Encounter: Payer: Self-pay | Admitting: Nurse Practitioner

## 2024-05-07 VITALS — BP 118/62 | HR 85 | Temp 97.8°F | Ht 72.0 in | Wt 191.5 lb

## 2024-05-07 DIAGNOSIS — L608 Other nail disorders: Secondary | ICD-10-CM | POA: Diagnosis not present

## 2024-05-07 DIAGNOSIS — J0140 Acute pansinusitis, unspecified: Secondary | ICD-10-CM

## 2024-05-07 MED ORDER — AMOXICILLIN-POT CLAVULANATE 875-125 MG PO TABS: 1.0000 | ORAL_TABLET | Freq: Two times a day (BID) | ORAL | 0 refills | Status: AC

## 2024-05-07 NOTE — Progress Notes (Signed)
 BP 118/62 (BP Location: Left Arm, Patient Position: Sitting, Cuff Size: Normal)   Pulse 85   Temp 97.8 F (36.6 C) (Oral)   Ht 6' (1.829 m)   Wt 191 lb 8 oz (86.9 kg)   SpO2 99%   BMI 25.97 kg/m    Subjective:    Patient ID: Dylan Huang, male    DOB: Nov 06, 1996, 28 y.o.   MRN: [id number]  HPI: Dylan Huang is a 28 y.o. male  Chief Complaint  Patient presents with   URI    Onset 1.5 weeks, productive cough green mucus sometimes with blood, nasal congestion green/ yelllow and sometimes with blood, headache, sinus pressure, fatigue, right ear pain, lightheaded    Nail Problem    White spot under L thumb nail     Discussed the use of AI scribe software for clinical note transcription with the patient, who gave verbal consent to proceed.  History of Present Illness Dylan Huang is a 28 year old male who presents with symptoms of an upper respiratory infection.  He has been experiencing symptoms consistent with an upper respiratory infection for approximately one and a half weeks, including a productive cough, nasal congestion with green and yellow mucus sometimes tinged with blood, headaches, sinus pressure, fatigue, right ear pain, and lightheadedness. He also reports chills with a maximum temperature of 98.72F and describes his hands as feeling cold. He notes tooth pain with pressure on both sides of his face when biting down.  He had an E visit on Apr 27, 2024, for these symptoms and was prescribed Tessalon  Perles and Flonase , although he did not take the prescribed medications as he was unaware of the prescription. Instead, he has been using an over-the-counter combination medication for mucus and congestion, which initially provided relief but is no longer effective.  No shortness of breath or fever. He mentions a white spot under his left thumbnail, which he noticed about four days ago.         05/07/2024   10:01 AM 02/16/2024    8:25 AM 11/01/2023    3:10 PM   Depression screen PHQ 2/9  Decreased Interest 0 0 1  Down, Depressed, Hopeless 0 1 1  PHQ - 2 Score 0 1 2  Altered sleeping 0 3 3  Tired, decreased energy 0 1 1  Change in appetite 0 0 0  Feeling bad or failure about yourself  0 0 0  Trouble concentrating 0 1 0  Moving slowly or fidgety/restless 0 0 0  Suicidal thoughts 0 0 0  PHQ-9 Score 0 6 6  Difficult doing work/chores Not difficult at all Not difficult at all Not difficult at all    Relevant past medical, surgical, family and social history reviewed and updated as indicated. Interim medical history since our last visit reviewed. Allergies and medications reviewed and updated.  Review of Systems  Ten systems reviewed and is negative except as mentioned in HPI      Objective:      BP 118/62 (BP Location: Left Arm, Patient Position: Sitting, Cuff Size: Normal)   Pulse 85   Temp 97.8 F (36.6 C) (Oral)   Ht 6' (1.829 m)   Wt 191 lb 8 oz (86.9 kg)   SpO2 99%   BMI 25.97 kg/m    Wt Readings from Last 3 Encounters:  05/07/24 191 lb 8 oz (86.9 kg)  02/16/24 190 lb 4.8 oz (86.3 kg)  02/14/24 192 lb 14.4 oz (87.5  kg)    Physical Exam Vitals reviewed.  Constitutional:      Appearance: Normal appearance.  HENT:     Head: Normocephalic.     Right Ear: A middle ear effusion is present.     Left Ear: A middle ear effusion is present.     Nose:     Right Sinus: Maxillary sinus tenderness and frontal sinus tenderness present.     Left Sinus: Maxillary sinus tenderness and frontal sinus tenderness present.     Mouth/Throat:     Mouth: Mucous membranes are moist.     Pharynx: Oropharynx is clear. No oropharyngeal exudate or posterior oropharyngeal erythema.  Eyes:     Extraocular Movements: Extraocular movements intact.     Conjunctiva/sclera: Conjunctivae normal.     Pupils: Pupils are equal, round, and reactive to light.  Cardiovascular:     Rate and Rhythm: Normal rate and regular rhythm.  Pulmonary:     Effort:  Pulmonary effort is normal.     Breath sounds: Normal breath sounds.  Skin:    General: Skin is warm and dry.  Neurological:     General: No focal deficit present.     Mental Status: He is alert and oriented to person, place, and time. Mental status is at baseline.  Psychiatric:        Mood and Affect: Mood normal.        Behavior: Behavior normal.        Thought Content: Thought content normal.        Judgment: Judgment normal.        Results for orders placed or performed in visit on 02/16/24  CBC with Differential/Platelet   Collection Time: 02/16/24  8:49 AM  Result Value Ref Range   WBC 7.1 3.8 - 10.8 Thousand/uL   RBC 5.27 4.20 - 5.80 Million/uL   Hemoglobin 15.3 13.2 - 17.1 g/dL   HCT 16.1 09.6 - 04.5 %   MCV 89.0 80.0 - 100.0 fL   MCH 29.0 27.0 - 33.0 pg   MCHC 32.6 32.0 - 36.0 g/dL   RDW 40.9 81.1 - 91.4 %   Platelets 296 140 - 400 Thousand/uL   MPV 10.6 7.5 - 12.5 fL   Neutro Abs 4,381 1,500 - 7,800 cells/uL   Absolute Lymphocytes 2,031 850 - 3,900 cells/uL   Absolute Monocytes 469 200 - 950 cells/uL   Eosinophils Absolute 192 15 - 500 cells/uL   Basophils Absolute 28 0 - 200 cells/uL   Neutrophils Relative % 61.7 %   Total Lymphocyte 28.6 %   Monocytes Relative 6.6 %   Eosinophils Relative 2.7 %   Basophils Relative 0.4 %  COMPLETE METABOLIC PANEL WITH GFR   Collection Time: 02/16/24  8:49 AM  Result Value Ref Range   Glucose, Bld 90 65 - 99 mg/dL   BUN 22 7 - 25 mg/dL   Creat 7.82 9.56 - 2.13 mg/dL   BUN/Creatinine Ratio SEE NOTE: 6 - 22 (calc)   Sodium 141 135 - 146 mmol/L   Potassium 4.5 3.5 - 5.3 mmol/L   Chloride 105 98 - 110 mmol/L   CO2 30 20 - 32 mmol/L   Calcium 9.9 8.6 - 10.3 mg/dL   Total Protein 7.7 6.1 - 8.1 g/dL   Albumin 4.9 3.6 - 5.1 g/dL   Globulin 2.8 1.9 - 3.7 g/dL (calc)   AG Ratio 1.8 1.0 - 2.5 (calc)   Total Bilirubin 0.8 0.2 - 1.2 mg/dL   Alkaline phosphatase (APISO)  77 36 - 130 U/L   AST 18 10 - 40 U/L   ALT 14 9 - 46 U/L   Sedimentation rate   Collection Time: 02/16/24  8:49 AM  Result Value Ref Range   Sed Rate 6 0 - 15 mm/h  C-reactive protein   Collection Time: 02/16/24  8:49 AM  Result Value Ref Range   CRP <3.0 <8.0 mg/L  ANA   Collection Time: 02/16/24  8:49 AM  Result Value Ref Range   Anti Nuclear Antibody (ANA) NEGATIVE NEGATIVE  Rheumatoid Factor   Collection Time: 02/16/24  8:49 AM  Result Value Ref Range   Rheumatoid fact SerPl-aCnc <10 <14 IU/mL  Troponin I   Collection Time: 02/16/24  8:49 AM  Result Value Ref Range   Troponin I 4 < OR = 47 ng/L  TSH   Collection Time: 02/16/24  8:49 AM  Result Value Ref Range   TSH 3.93 0.40 - 4.50 mIU/L          Assessment & Plan:   Problem List Items Addressed This Visit   None Visit Diagnoses       Acute non-recurrent pansinusitis    -  Primary   Relevant Medications   amoxicillin -clavulanate (AUGMENTIN ) 875-125 MG tablet     Leukonychia            Assessment and Plan Assessment & Plan Sinusitis Symptoms consistent with sinusitis, including productive cough, nasal congestion with green and yellow mucus sometimes tinged with blood, headaches, sinus pressure, fatigue, right ear pain, and lightheadedness, persisting for about a week and a half. Fluid behind both ears suggests possible eustachian tube dysfunction, but no current ear infection. Previously prescribed Tessalon  Perles and Flonase , which he did not take. Allergic to doxycycline, necessitating alternative antibiotics. Decision to treat with Augmentin  due to symptom progression to sinus infection. Risks of Augmentin  include diarrhea; advised to increase fluid intake to mitigate this side effect. Flonase , Zyrtec , Claritin, and Mucinex recommended for symptom relief and mucus clearance. Advised to avoid dairy to prevent mucus thickening. - Prescribe Augmentin  875 mg twice daily for 10 days - Continue Flonase  nasal spray - Recommend over-the-counter allergy medication such as  Zyrtec  or Claritin - Advise taking Mucinex for mucus clearance - Encourage increased fluid intake - Advise avoiding dairy products to prevent mucus thickening  Leukonychia White spot under the left thumbnail identified as leukonychia, noticed about four days ago. Benign and often results from minor trauma or illness. Expected to grow out with the nail and does not require treatment unless it changes color to black, indicating other issues. - Monitor the white spot on the nail for changes - Reassure that the condition is benign and will grow out with the nail        Follow up plan: Return if symptoms worsen or fail to improve.

## 2024-07-10 ENCOUNTER — Ambulatory Visit: Admitting: Nurse Practitioner

## 2024-07-10 NOTE — Progress Notes (Deleted)
 There were no vitals taken for this visit.   Subjective:    Patient ID: Dylan Huang, male    DOB: 1996-11-15, 28 y.o.   MRN: [id number]  HPI: Dylan Huang is a 28 y.o. male  No chief complaint on file.   Discussed the use of AI scribe software for clinical note transcription with the patient, who gave verbal consent to proceed.  History of Present Illness          05/07/2024   10:01 AM 02/16/2024    8:25 AM 11/01/2023    3:10 PM  Depression screen PHQ 2/9  Decreased Interest 0 0 1  Down, Depressed, Hopeless 0 1 1  PHQ - 2 Score 0 1 2  Altered sleeping 0 3 3  Tired, decreased energy 0 1 1  Change in appetite 0 0 0  Feeling bad or failure about yourself  0 0 0  Trouble concentrating 0 1 0  Moving slowly or fidgety/restless 0 0 0  Suicidal thoughts 0 0 0  PHQ-9 Score 0 6 6  Difficult doing work/chores Not difficult at all Not difficult at all Not difficult at all    Relevant past medical, surgical, family and social history reviewed and updated as indicated. Interim medical history since our last visit reviewed. Allergies and medications reviewed and updated.  Review of Systems  Per HPI unless specifically indicated above     Objective:     There were no vitals taken for this visit.  {Vitals History (Optional):23777} Wt Readings from Last 3 Encounters:  05/07/24 191 lb 8 oz (86.9 kg)  02/16/24 190 lb 4.8 oz (86.3 kg)  02/14/24 192 lb 14.4 oz (87.5 kg)    Physical Exam Physical Exam    Results for orders placed or performed in visit on 02/16/24  CBC with Differential/Platelet   Collection Time: 02/16/24  8:49 AM  Result Value Ref Range   WBC 7.1 3.8 - 10.8 Thousand/uL   RBC 5.27 4.20 - 5.80 Million/uL   Hemoglobin 15.3 13.2 - 17.1 g/dL   HCT 53.0 61.4 - 49.9 %   MCV 89.0 80.0 - 100.0 fL   MCH 29.0 27.0 - 33.0 pg   MCHC 32.6 32.0 - 36.0 g/dL   RDW 87.3 88.9 - 84.9 %   Platelets 296 140 - 400 Thousand/uL   MPV 10.6 7.5 - 12.5 fL   Neutro Abs  4,381 1,500 - 7,800 cells/uL   Absolute Lymphocytes 2,031 850 - 3,900 cells/uL   Absolute Monocytes 469 200 - 950 cells/uL   Eosinophils Absolute 192 15 - 500 cells/uL   Basophils Absolute 28 0 - 200 cells/uL   Neutrophils Relative % 61.7 %   Total Lymphocyte 28.6 %   Monocytes Relative 6.6 %   Eosinophils Relative 2.7 %   Basophils Relative 0.4 %  COMPLETE METABOLIC PANEL WITH GFR   Collection Time: 02/16/24  8:49 AM  Result Value Ref Range   Glucose, Bld 90 65 - 99 mg/dL   BUN 22 7 - 25 mg/dL   Creat 8.86 9.39 - 8.75 mg/dL   BUN/Creatinine Ratio SEE NOTE: 6 - 22 (calc)   Sodium 141 135 - 146 mmol/L   Potassium 4.5 3.5 - 5.3 mmol/L   Chloride 105 98 - 110 mmol/L   CO2 30 20 - 32 mmol/L   Calcium 9.9 8.6 - 10.3 mg/dL   Total Protein 7.7 6.1 - 8.1 g/dL   Albumin 4.9 3.6 - 5.1 g/dL   Globulin  2.8 1.9 - 3.7 g/dL (calc)   AG Ratio 1.8 1.0 - 2.5 (calc)   Total Bilirubin 0.8 0.2 - 1.2 mg/dL   Alkaline phosphatase (APISO) 77 36 - 130 U/L   AST 18 10 - 40 U/L   ALT 14 9 - 46 U/L  Sedimentation rate   Collection Time: 02/16/24  8:49 AM  Result Value Ref Range   Sed Rate 6 0 - 15 mm/h  C-reactive protein   Collection Time: 02/16/24  8:49 AM  Result Value Ref Range   CRP <3.0 <8.0 mg/L  ANA   Collection Time: 02/16/24  8:49 AM  Result Value Ref Range   Anti Nuclear Antibody (ANA) NEGATIVE NEGATIVE  Rheumatoid Factor   Collection Time: 02/16/24  8:49 AM  Result Value Ref Range   Rheumatoid fact SerPl-aCnc <10 <14 IU/mL  Troponin I   Collection Time: 02/16/24  8:49 AM  Result Value Ref Range   Troponin I 4 < OR = 47 ng/L  TSH   Collection Time: 02/16/24  8:49 AM  Result Value Ref Range   TSH 3.93 0.40 - 4.50 mIU/L   {Labs (Optional):23779}       Assessment & Plan:   Problem List Items Addressed This Visit   None    Assessment and Plan Assessment & Plan         Follow up plan: No follow-ups on file.

## 2024-09-14 ENCOUNTER — Telehealth: Payer: Self-pay | Admitting: Nurse Practitioner

## 2024-09-14 DIAGNOSIS — L03031 Cellulitis of right toe: Secondary | ICD-10-CM

## 2024-09-14 MED ORDER — SULFAMETHOXAZOLE-TRIMETHOPRIM 800-160 MG PO TABS: 1.0000 | ORAL_TABLET | Freq: Two times a day (BID) | ORAL | 0 refills | Status: AC

## 2024-09-14 MED ORDER — MUPIROCIN 2 % EX OINT: 1.0000 | TOPICAL_OINTMENT | Freq: Two times a day (BID) | CUTANEOUS | 0 refills | Status: AC

## 2024-09-14 NOTE — Progress Notes (Signed)
 Virtual Visit Consent   Dylan Huang, you are scheduled for a virtual visit with a [facility] provider today. Just as with appointments in the office, your consent must be obtained to participate. Your consent will be active for this visit and any virtual visit you may have with one of our providers in the next 365 days. If you have a MyChart account, a copy of this consent can be sent to you electronically.  As this is a virtual visit, video technology does not allow for your provider to perform a traditional examination. This may limit your provider's ability to fully assess your condition. If your provider identifies any concerns that need to be evaluated in person or the need to arrange testing (such as labs, EKG, etc.), we will make arrangements to do so. Although advances in technology are sophisticated, we cannot ensure that it will always work on either your end or our end. If the connection with a video visit is poor, the visit may have to be switched to a telephone visit. With either a video or telephone visit, we are not always able to ensure that we have a secure connection.  By engaging in this virtual visit, you consent to the provision of healthcare and authorize for your insurance to be billed (if applicable) for the services provided during this visit. Depending on your insurance coverage, you may receive a charge related to this service.  I need to obtain your verbal consent now. Are you willing to proceed with your visit today? Dylan Huang has provided verbal consent on 09/14/2024 for a virtual visit (video or telephone). Haze LELON Servant, NP  Date: 09/14/2024 2:09 PM   Virtual Visit via Video Note   I, Haze LELON Servant, connected with  Dylan Huang  ([id number], [birth year]) on 09/14/24 at  2:00 PM EDT by a video-enabled telemedicine application and verified that I am speaking with the correct person using two identifiers.  Location: Patient: Virtual Visit Location Patient:  Home Provider: Virtual Visit Location Provider: Home Office   I discussed the limitations of evaluation and management by telemedicine and the availability of in person appointments. The patient expressed understanding and agreed to proceed.    History of Present Illness: Dylan Huang is a 28 y.o. who identifies as a male who was assigned male at birth, and is being seen today for ingrown toenail.  Mr. Whittinghill has been experiencing right ankle pain along with ingown toenail of the right big toe. Associated symptoms include brown pus around toenail and nail bed and erythema of right big toe. t   Problems:  Patient Active Problem List   Diagnosis Date Noted   Migraine without aura and without status migrainosus, not intractable 11/01/2023   Mild episode of recurrent major depressive disorder 11/01/2023    Allergies:  Allergies  Allergen Reactions   Doxycycline Shortness Of Breath   Cinnamon Other (See Comments)    Other reaction(s): Headache   Doxycycline Hyclate     Unsure. Felt like choking.   Nickel Rash   Medications:  Current Outpatient Medications:    ibuprofen  (ADVIL ) 600 MG tablet, Take 1 tablet (600 mg total) by mouth every 8 (eight) hours as needed., Disp: 30 tablet, Rfl: 0   mupirocin ointment (BACTROBAN) 2 %, Apply 1 Application topically 2 (two) times daily., Disp: 30 g, Rfl: 0   sulfamethoxazole-trimethoprim (BACTRIM DS) 800-160 MG tablet, Take 1 tablet by mouth 2 (two) times daily for 7 days., Disp: 14  tablet, Rfl: 0   amoxicillin -clavulanate (AUGMENTIN ) 875-125 MG tablet, Take 1 tablet by mouth 2 (two) times daily., Disp: 20 tablet, Rfl: 0   benzonatate  (TESSALON  PERLES) 100 MG capsule, Take 1 capsule (100 mg total) by mouth 3 (three) times daily as needed. (Patient not taking: Reported on 05/07/2024), Disp: 20 capsule, Rfl: 0   cetirizine  (ZYRTEC  ALLERGY) 10 MG tablet, Take 1 tablet (10 mg total) by mouth at bedtime for 14 days., Disp: 14 tablet, Rfl: 0    EPINEPHrine  0.3 mg/0.3 mL IJ SOAJ injection, Inject 0.3 mg into the muscle as needed., Disp: 1 each, Rfl: 0   fluticasone  (FLONASE ) 50 MCG/ACT nasal spray, Place 2 sprays into both nostrils daily. (Patient not taking: Reported on 05/07/2024), Disp: 16 g, Rfl: 6   hydrocortisone  cream 0.5 %, Apply 1 Application topically 2 (two) times daily., Disp: 30 g, Rfl: 0   hydrOXYzine  (VISTARIL ) 25 MG capsule, Take 1 capsule (25 mg total) by mouth at bedtime as needed for itching. (Patient not taking: Reported on 05/07/2024), Disp: 30 capsule, Rfl: 0   meclizine  (ANTIVERT ) 12.5 MG tablet, Take 1 tablet (12.5 mg total) by mouth 3 (three) times daily as needed for dizziness. (Patient not taking: Reported on 05/07/2024), Disp: 30 tablet, Rfl: 0   Multiple Vitamin (MULTIVITAMIN WITH MINERALS) TABS tablet, Take 1 tablet by mouth daily., Disp: , Rfl:   Observations/Objective: Patient is well-developed, well-nourished in no acute distress.  Resting comfortably at home.  Head is normocephalic, atraumatic.  No labored breathing.  Speech is clear and coherent with logical content.  Patient is alert and oriented at baseline.  Right toenail with onychomycosis, redness and swelling of toe and around nail bed.   Assessment and Plan: 1. Infection of nail bed of toe of right foot (Primary) - ibuprofen  (ADVIL ) 600 MG tablet; Take 1 tablet (600 mg total) by mouth every 8 (eight) hours as needed.  Dispense: 30 tablet; Refill: 0 - mupirocin ointment (BACTROBAN) 2 %; Apply 1 Application topically 2 (two) times daily.  Dispense: 30 g; Refill: 0 - sulfamethoxazole-trimethoprim (BACTRIM DS) 800-160 MG tablet; Take 1 tablet by mouth 2 (two) times daily for 7 days.  Dispense: 14 tablet; Refill: 0  Warm epsom soaks for removing ingrown toenail  Follow Up Instructions: I discussed the assessment and treatment plan with the patient. The patient was provided an opportunity to ask questions and all were answered. The patient agreed  with the plan and demonstrated an understanding of the instructions.  A copy of instructions were sent to the patient via MyChart unless otherwise noted below.    The patient was advised to call back or seek an in-person evaluation if the symptoms worsen or if the condition fails to improve as anticipated.    Rafiq Bucklin W Nowell Sites, NP

## 2024-09-14 NOTE — Patient Instructions (Signed)
 Dylan Huang, thank you for joining Dylan LELON Servant, Dylan Huang for today's virtual visit.  While this provider is not your primary care provider (PCP), if your PCP is located in our provider database this encounter information will be shared with them immediately following your visit.   A [facility] MyChart account gives you access to today's visit and all your visits, tests, and labs performed at [facility]  click here if you don't have a [facility] MyChart account or go to [email or web address]  Consent: (Patient) Dylan Huang provided verbal consent for this virtual visit at the beginning of the encounter.  Current Medications:  Current Outpatient Medications:    ibuprofen  (ADVIL ) 600 MG tablet, Take 1 tablet (600 mg total) by mouth every 8 (eight) hours as needed., Disp: 30 tablet, Rfl: 0   mupirocin ointment (BACTROBAN) 2 %, Apply 1 Application topically 2 (two) times daily., Disp: 30 g, Rfl: 0   sulfamethoxazole-trimethoprim (BACTRIM DS) 800-160 MG tablet, Take 1 tablet by mouth 2 (two) times daily for 7 days., Disp: 14 tablet, Rfl: 0   amoxicillin -clavulanate (AUGMENTIN ) 875-125 MG tablet, Take 1 tablet by mouth 2 (two) times daily., Disp: 20 tablet, Rfl: 0   benzonatate  (TESSALON  PERLES) 100 MG capsule, Take 1 capsule (100 mg total) by mouth 3 (three) times daily as needed. (Patient not taking: Reported on 05/07/2024), Disp: 20 capsule, Rfl: 0   cetirizine  (ZYRTEC  ALLERGY) 10 MG tablet, Take 1 tablet (10 mg total) by mouth at bedtime for 14 days., Disp: 14 tablet, Rfl: 0   EPINEPHrine  0.3 mg/0.3 mL IJ SOAJ injection, Inject 0.3 mg into the muscle as needed., Disp: 1 each, Rfl: 0   fluticasone  (FLONASE ) 50 MCG/ACT nasal spray, Place 2 sprays into both nostrils daily. (Patient not taking: Reported on 05/07/2024), Disp: 16 g, Rfl: 6   hydrocortisone  cream 0.5 %, Apply 1 Application topically 2 (two) times daily., Disp: 30 g, Rfl: 0   hydrOXYzine  (VISTARIL ) 25 MG capsule, Take  1 capsule (25 mg total) by mouth at bedtime as needed for itching. (Patient not taking: Reported on 05/07/2024), Disp: 30 capsule, Rfl: 0   meclizine  (ANTIVERT ) 12.5 MG tablet, Take 1 tablet (12.5 mg total) by mouth 3 (three) times daily as needed for dizziness. (Patient not taking: Reported on 05/07/2024), Disp: 30 tablet, Rfl: 0   Multiple Vitamin (MULTIVITAMIN WITH MINERALS) TABS tablet, Take 1 tablet by mouth daily., Disp: , Rfl:    Medications ordered in this encounter:  Meds ordered this encounter  Medications   ibuprofen  (ADVIL ) 600 MG tablet    Sig: Take 1 tablet (600 mg total) by mouth every 8 (eight) hours as needed.    Dispense:  30 tablet    Refill:  0    Supervising Provider:   LAMPTEY, PHILIP O [[id number]]   mupirocin ointment (BACTROBAN) 2 %    Sig: Apply 1 Application topically 2 (two) times daily.    Dispense:  30 g    Refill:  0    Supervising Provider:   LAMPTEY, PHILIP O [[id number]]   sulfamethoxazole-trimethoprim (BACTRIM DS) 800-160 MG tablet    Sig: Take 1 tablet by mouth 2 (two) times daily for 7 days.    Dispense:  14 tablet    Refill:  0    Supervising Provider:   BLAISE ALEENE KIDD [id number]     *If you need refills on other medications prior to your next appointment, please contact your pharmacy*  Follow-Up: Call back or seek an in-person  evaluation if the symptoms worsen or if the condition fails to improve as anticipated.  [facility] [phone]  Other Instructions Warm epsom soaks for removing ingrown toenail   If you have been instructed to have an in-person evaluation today at a local Urgent Care facility, please use the link below. It will take you to a list of all of our available [facility], including address, phone number and hours of operation. Please do not delay care.  [facility]  If you or a family member do not have a primary care provider, use the link below to schedule a visit and establish care.  When you choose a [facility] primary care physician or advanced practice provider, you gain a long-term partner in health. Find a Primary Care Provider  Learn more about [facility]'s in-office and virtual care options: [facility] - Get Care Now
